# Patient Record
Sex: Male | Born: 1965 | Race: White | Hispanic: No | Marital: Single | Smoking: Current every day smoker
Health system: Southern US, Community
[De-identification: ages and names within clinical notes are randomized; demographics above are authoritative.]

## PROBLEM LIST (undated history)

## (undated) DIAGNOSIS — N200 Calculus of kidney: Secondary | ICD-10-CM

## (undated) HISTORY — PX: APPENDECTOMY: SHX54

## (undated) HISTORY — PX: SHOULDER SURGERY: SHX246

## (undated) HISTORY — PX: KNEE ARTHROSCOPY: SUR90

---

## 1998-08-21 ENCOUNTER — Ambulatory Visit (HOSPITAL_BASED_OUTPATIENT_CLINIC_OR_DEPARTMENT_OTHER): Admission: RE | Admit: 1998-08-21 | Discharge: 1998-08-21 | Payer: Self-pay | Admitting: Orthopedic Surgery

## 1999-06-26 ENCOUNTER — Ambulatory Visit (HOSPITAL_BASED_OUTPATIENT_CLINIC_OR_DEPARTMENT_OTHER): Admission: RE | Admit: 1999-06-26 | Discharge: 1999-06-26 | Payer: Self-pay | Admitting: Orthopedic Surgery

## 2005-07-02 ENCOUNTER — Encounter: Admission: RE | Admit: 2005-07-02 | Discharge: 2005-07-02 | Payer: Self-pay | Admitting: Family Medicine

## 2012-03-08 ENCOUNTER — Other Ambulatory Visit: Payer: Self-pay

## 2012-03-08 ENCOUNTER — Emergency Department (HOSPITAL_COMMUNITY)
Admission: EM | Admit: 2012-03-08 | Discharge: 2012-03-09 | Disposition: A | Discharge status: Home / Self Care | Payer: Self-pay | Attending: Emergency Medicine | Admitting: Emergency Medicine

## 2012-03-08 DIAGNOSIS — F17200 Nicotine dependence, unspecified, uncomplicated: Secondary | ICD-10-CM | POA: Insufficient documentation

## 2012-03-08 DIAGNOSIS — R079 Chest pain, unspecified: Secondary | ICD-10-CM | POA: Insufficient documentation

## 2012-03-08 DIAGNOSIS — R51 Headache: Secondary | ICD-10-CM | POA: Insufficient documentation

## 2012-03-09 ENCOUNTER — Other Ambulatory Visit (HOSPITAL_COMMUNITY): Payer: Self-pay | Admitting: Emergency Medicine

## 2012-03-09 ENCOUNTER — Ambulatory Visit (HOSPITAL_COMMUNITY)
Admission: RE | Admit: 2012-03-09 | Discharge: 2012-03-09 | Disposition: A | Discharge status: Home / Self Care | Payer: Self-pay | Source: Ambulatory Visit | Attending: Emergency Medicine | Admitting: Emergency Medicine

## 2012-03-09 DIAGNOSIS — R079 Chest pain, unspecified: Secondary | ICD-10-CM | POA: Insufficient documentation

## 2012-03-09 DIAGNOSIS — M79609 Pain in unspecified limb: Secondary | ICD-10-CM | POA: Insufficient documentation

## 2012-03-09 LAB — BASIC METABOLIC PANEL
BUN: 13 mg/dL (ref 6–23)
CO2: 25 mEq/L (ref 19–32)
Chloride: 108 mEq/L (ref 96–112)
Glucose, Bld: 91 mg/dL (ref 70–99)
Potassium: 3.9 mEq/L (ref 3.5–5.1)

## 2012-03-09 LAB — POCT I-STAT, CHEM 8
Calcium, Ion: 1.22 mmol/L (ref 1.12–1.23)
Creatinine, Ser: 0.9 mg/dL (ref 0.50–1.35)
Glucose, Bld: 102 mg/dL — ABNORMAL HIGH (ref 70–99)
Hemoglobin: 15.6 g/dL (ref 13.0–17.0)
Potassium: 3.9 mEq/L (ref 3.5–5.1)
TCO2: 24 mmol/L (ref 0–100)

## 2012-03-09 LAB — CBC
Hemoglobin: 15.9 g/dL (ref 13.0–17.0)
MCH: 31.9 pg (ref 26.0–34.0)
RBC: 4.99 MIL/uL (ref 4.22–5.81)

## 2012-03-09 LAB — DIFFERENTIAL
Lymphocytes Relative: 31 % (ref 12–46)
Lymphs Abs: 2.7 10*3/uL (ref 0.7–4.0)
Monocytes Relative: 7 % (ref 3–12)
Neutro Abs: 5.3 10*3/uL (ref 1.7–7.7)
Neutrophils Relative %: 61 % (ref 43–77)

## 2012-03-09 NOTE — ED Notes (Signed)
See downtime charting. 

## 2013-01-16 ENCOUNTER — Encounter (HOSPITAL_COMMUNITY): Payer: Self-pay | Admitting: Emergency Medicine

## 2013-01-16 ENCOUNTER — Emergency Department (HOSPITAL_COMMUNITY)
Admission: EM | Admit: 2013-01-16 | Discharge: 2013-01-16 | Disposition: A | Discharge status: Home / Self Care | Payer: BC Managed Care – PPO | Attending: Emergency Medicine | Admitting: Emergency Medicine

## 2013-01-16 ENCOUNTER — Emergency Department (HOSPITAL_COMMUNITY): Payer: BC Managed Care – PPO

## 2013-01-16 DIAGNOSIS — N201 Calculus of ureter: Secondary | ICD-10-CM | POA: Insufficient documentation

## 2013-01-16 DIAGNOSIS — N209 Urinary calculus, unspecified: Secondary | ICD-10-CM

## 2013-01-16 DIAGNOSIS — F17200 Nicotine dependence, unspecified, uncomplicated: Secondary | ICD-10-CM | POA: Insufficient documentation

## 2013-01-16 DIAGNOSIS — Z885 Allergy status to narcotic agent status: Secondary | ICD-10-CM | POA: Insufficient documentation

## 2013-01-16 DIAGNOSIS — Z87442 Personal history of urinary calculi: Secondary | ICD-10-CM | POA: Insufficient documentation

## 2013-01-16 DIAGNOSIS — R109 Unspecified abdominal pain: Secondary | ICD-10-CM

## 2013-01-16 HISTORY — DX: Calculus of kidney: N20.0

## 2013-01-16 LAB — URINALYSIS, ROUTINE W REFLEX MICROSCOPIC
Glucose, UA: NEGATIVE mg/dL
Ketones, ur: 15 mg/dL — AB
Leukocytes, UA: NEGATIVE
Nitrite: NEGATIVE
Specific Gravity, Urine: 1.039 — ABNORMAL HIGH (ref 1.005–1.030)
Urobilinogen, UA: 1 mg/dL (ref 0.0–1.0)
pH: 5 (ref 5.0–8.0)

## 2013-01-16 LAB — BASIC METABOLIC PANEL
BUN: 19 mg/dL (ref 6–23)
CO2: 22 mEq/L (ref 19–32)
Calcium: 9.9 mg/dL (ref 8.4–10.5)
Chloride: 103 mEq/L (ref 96–112)
Creatinine, Ser: 1.16 mg/dL (ref 0.50–1.35)
GFR calc Af Amer: 86 mL/min — ABNORMAL LOW (ref >90)
GFR calc non Af Amer: 74 mL/min — ABNORMAL LOW (ref >90)
Glucose, Bld: 99 mg/dL (ref 70–99)
Potassium: 4 mEq/L (ref 3.5–5.1)

## 2013-01-16 LAB — CBC WITH DIFFERENTIAL/PLATELET
Basophils Absolute: 0.1 10*3/uL (ref 0.0–0.1)
Basophils Relative: 0 % (ref 0–1)
Eosinophils Absolute: 0 10*3/uL (ref 0.0–0.7)
Eosinophils Relative: 0 % (ref 0–5)
HCT: 46.9 % (ref 39.0–52.0)
Hemoglobin: 16.9 g/dL (ref 13.0–17.0)
Lymphs Abs: 2.5 10*3/uL (ref 0.7–4.0)
MCH: 32.1 pg (ref 26.0–34.0)
MCHC: 36 g/dL (ref 30.0–36.0)
MCV: 89.2 fL (ref 78.0–100.0)
Monocytes Absolute: 0.7 10*3/uL (ref 0.1–1.0)
Monocytes Relative: 5 % (ref 3–12)
Neutrophils Relative %: 75 % (ref 43–77)
Platelets: 272 10*3/uL (ref 150–400)
RDW: 13.4 % (ref 11.5–15.5)

## 2013-01-16 LAB — URINE MICROSCOPIC-ADD ON

## 2013-01-16 LAB — GRAM STAIN

## 2013-01-16 MED ORDER — DOCUSATE SODIUM 100 MG PO CAPS: 100.0000 mg | ORAL_CAPSULE | Freq: Two times a day (BID) | ORAL | Status: DC

## 2013-01-16 MED ORDER — FENTANYL CITRATE 0.05 MG/ML IJ SOLN
50.0000 ug | Freq: Once | INTRAMUSCULAR | Status: AC
  Administered 2013-01-16: 50 ug via INTRAVENOUS

## 2013-01-16 MED ORDER — OXYCODONE-ACETAMINOPHEN 5-325 MG PO TABS
2.0000 | ORAL_TABLET | Freq: Once | ORAL | Status: AC
  Administered 2013-01-16: 2 via ORAL
  Filled 2013-01-16: qty 2

## 2013-01-16 MED ORDER — HYDROMORPHONE HCL PF 1 MG/ML IJ SOLN
0.5000 mg | Freq: Once | INTRAMUSCULAR | Status: AC
  Administered 2013-01-16: 0.5 mg via INTRAVENOUS
  Filled 2013-01-16: qty 1

## 2013-01-16 MED ORDER — SODIUM CHLORIDE 0.9 % IV BOLUS (SEPSIS)
1000.0000 mL | Freq: Once | INTRAVENOUS | Status: AC
  Administered 2013-01-16: 1000 mL via INTRAVENOUS

## 2013-01-16 MED ORDER — KETOROLAC TROMETHAMINE 15 MG/ML IJ SOLN
15.0000 mg | Freq: Once | INTRAMUSCULAR | Status: AC
  Administered 2013-01-16: 15 mg via INTRAVENOUS
  Filled 2013-01-16: qty 1

## 2013-01-16 MED ORDER — ONDANSETRON HCL 4 MG/2ML IJ SOLN
4.0000 mg | Freq: Once | INTRAMUSCULAR | Status: AC
  Administered 2013-01-16: 4 mg via INTRAVENOUS
  Filled 2013-01-16: qty 2

## 2013-01-16 MED ORDER — OXYCODONE-ACETAMINOPHEN 5-325 MG PO TABS: 1.0000 | ORAL_TABLET | ORAL | Status: DC | PRN

## 2013-01-16 MED ORDER — FENTANYL CITRATE 0.05 MG/ML IJ SOLN
INTRAMUSCULAR | Status: AC
  Filled 2013-01-16: qty 2

## 2013-01-16 MED ORDER — TAMSULOSIN HCL 0.4 MG PO CAPS: 0.4000 mg | ORAL_CAPSULE | Freq: Every day | ORAL | Status: DC

## 2013-01-16 NOTE — ED Notes (Signed)
Pt made aware that urine sample is needed. Unable to void at this time.

## 2013-01-16 NOTE — ED Provider Notes (Signed)
History     CSN: [id number]  Arrival date & time 01/16/13  1436   First MD Initiated Contact with Patient 01/16/13 1522      Chief Complaint  Patient presents with  . Flank Pain    (Consider location/radiation/quality/duration/timing/severity/associated sxs/prior treatment) HPI Comments: 47 y.o. male who has pmh of kidney stone, stating that he had a "large right sided kidney stone" many years ago, and now he feels like he has a stone on the left side. He has sharp flank pain, started suddenly today. Pain comes and goes. He states he can't get comfortable. No fevers or chills with this. States he has not been drinking as much water as he usually does over the past week, and noticed blood in his urine today.   Patient is a 47 y.o. male presenting with general illness. The history is provided by the patient.  Illness Location:  Left flank Severity:  Mild Onset quality:  Sudden Timing:  Intermittent Progression:  Waxing and waning Chronicity:  Recurrent Associated symptoms: no abdominal pain, no chest pain, no congestion, no diarrhea, no headaches, no nausea, no rash, no shortness of breath and no wheezing     Past Medical History  Diagnosis Date  . Kidney stone     History reviewed. No pertinent past surgical history.  History reviewed. No pertinent family history.  History  Substance Use Topics  . Smoking status: Current Every Day Smoker  . Smokeless tobacco: Not on file  . Alcohol Use: Yes     Comment: occ      Review of Systems  Constitutional: Negative for chills and activity change.  HENT: Negative for congestion, drooling, mouth sores and neck pain.   Eyes: Negative for pain and discharge.  Respiratory: Negative for chest tightness, shortness of breath and wheezing.   Cardiovascular: Negative for chest pain.  Gastrointestinal: Negative for nausea, abdominal pain, diarrhea and constipation.  Genitourinary: Negative for dysuria, flank pain and difficulty  urinating.       Blood in urine.   Musculoskeletal: Negative for back pain and arthralgias.  Skin: Negative for color change, pallor and rash.  Neurological: Negative for dizziness, weakness, light-headedness and headaches.  Psychiatric/Behavioral: Negative for behavioral problems, confusion and agitation.    Allergies  Codeine  Home Medications  No current outpatient prescriptions on file.  BP 128/86  Pulse 89  Temp(Src) 97.8 F (36.6 C) (Oral)  Resp 18  SpO2 96%  Physical Exam  Constitutional: He is oriented to person, place, and time. He appears well-developed. No distress.  HENT:  Head: Normocephalic.  Eyes: Pupils are equal, round, and reactive to light. Right eye exhibits no discharge. Left eye exhibits no discharge.  Neck: Neck supple. No tracheal deviation present.  Cardiovascular: Regular rhythm and intact distal pulses.   Pulmonary/Chest: Effort normal. No stridor. No respiratory distress. He has no wheezes.  Abdominal: Soft. He exhibits no distension. There is no tenderness. There is no rebound.  Musculoskeletal: Normal range of motion. He exhibits no tenderness.  Neurological: He is alert and oriented to person, place, and time. No cranial nerve deficit.  Skin: Skin is warm. No rash noted. He is not diaphoretic. No erythema.    ED Course  Procedures (including critical care time)  Labs Reviewed  CBC WITH DIFFERENTIAL - Abnormal; Notable for the following:    WBC 12.6 (*)    Neutro Abs 9.4 (*)    All other components within normal limits  BASIC METABOLIC PANEL - Abnormal; Notable for  the following:    GFR calc non Af Amer 74 (*)    GFR calc Af Amer 86 (*)    All other components within normal limits  URINALYSIS, ROUTINE W REFLEX MICROSCOPIC - Abnormal; Notable for the following:    Color, Urine AMBER (*)    APPearance CLOUDY (*)    Specific Gravity, Urine 1.039 (*)    Hgb urine dipstick LARGE (*)    Bilirubin Urine SMALL (*)    Ketones, ur 15 (*)     Protein, ur 30 (*)    All other components within normal limits  URINE MICROSCOPIC-ADD ON - Abnormal; Notable for the following:    Squamous Epithelial / LPF FEW (*)    All other components within normal limits  GRAM STAIN   Ct Abdomen Pelvis Wo Contrast  01/16/2013   *RADIOLOGY REPORT*  Clinical Data: Left flank pain.  History of kidney stones.  CT ABDOMEN AND PELVIS WITHOUT CONTRAST  Technique:  Multidetector CT imaging of the abdomen and pelvis was performed following the standard protocol without intravenous contrast.  Comparison: 07/02/2005.  Findings: There is mild dependent atelectasis at both lung bases. There is an air cyst in the right lower lobe on image #1.  No significant pleural or pericardial effusion is present.  No renal calculi are demonstrated.  There is mild left-sided hydronephrosis and hydroureter with associated perinephric soft tissue stranding.  The left ureter is dilated to an obstructing 2 mm calculus (image 46 at the L3-L4 disc space level).  This is not clearly seen on the scout image.  The distal ureter is decompressed.  There is no evidence of bladder calculus, although the bladder is nearly empty.  No other renal abnormalities are identified.  There is hepatic low density consistent with steatosis.  The spleen, gallbladder, pancreas and adrenal glands appear normal.  No inflammatory changes are identified.  There is metallic foreign body within the lumen of the transverse colon, likely ingested bird shot.  The prostate gland and seminal vesicles appear normal. There is mildly prominent fat within the left inguinal canal which appears stable.  IMPRESSION:  1.  Obstructing 2 mm calculus in the proximal left ureter. Associated mild hydronephrosis. 2.  No renal calculi demonstrated. 3.  Hepatic steatosis.   Original Report Authenticated By: Carey Bullocks, M.D.     MDM  Suspect kidney stone based on pt's prior hx and hpi. Doubt diverticulitis, doubt pyelo, doubt obstruction.  Pt states he has not had frequent CT scans, stating he can't remember the last time he had one. Will get CT stone study to eval for hydro, and size of pt's stone. Will control pain and nausea in the interim.   Pt has urolithiasis -- he has mild hydro, but stone is only 2mm in size. Urine does not show infection.  Due to small stone size, no signs of infection, not febrile -- does not need immediate surgical intervention. Will attempt medical expulsion therapy first with flomax, pain medication. Pt's pain will be controlled in the Er.   Have told pt to return to the ER if he develops fevers / chills, inability to tolerate PO, or if he feels he needs re-evaluation. He is told to f/u with urology in 4 to 5 days if symptoms do not improve and to see his pcp on Monday for re-evaluation.   1. Flank pain   2. Urolithiasis             Bernadene Person, MD 01/16/13 [phone]

## 2013-01-16 NOTE — ED Notes (Addendum)
Pt c/o left sided lower back and flank pain starting this am; pt sts hx of kidney stone and sts feels same; pt sts possible hematuria

## 2013-01-16 NOTE — ED Provider Notes (Signed)
I have supervised the resident on the management of this patient and agree with the note above. I personally interviewed and examined the patient and my addendum is below.   Richard Munoz is a 47 y.o. male hx of R kidney stone s/p lithotripsy here with L flank pain. Sharp, l flank pain. + L CVAT. CT showed 2 mm stone with mild hydro. UA showed no UTI. His pain was controlled in the ED and d/c home on pain meds and flomax and urology f/u.    Richardean Canal, MD 01/16/13 [phone]

## 2013-01-21 ENCOUNTER — Encounter (HOSPITAL_COMMUNITY): Payer: Self-pay | Admitting: Emergency Medicine

## 2013-01-21 ENCOUNTER — Emergency Department (HOSPITAL_COMMUNITY)
Admission: EM | Admit: 2013-01-21 | Discharge: 2013-01-21 | Disposition: A | Discharge status: Home / Self Care | Payer: BC Managed Care – PPO | Attending: Emergency Medicine | Admitting: Emergency Medicine

## 2013-01-21 DIAGNOSIS — N23 Unspecified renal colic: Secondary | ICD-10-CM | POA: Insufficient documentation

## 2013-01-21 DIAGNOSIS — R112 Nausea with vomiting, unspecified: Secondary | ICD-10-CM | POA: Insufficient documentation

## 2013-01-21 DIAGNOSIS — M545 Low back pain, unspecified: Secondary | ICD-10-CM | POA: Insufficient documentation

## 2013-01-21 DIAGNOSIS — R3915 Urgency of urination: Secondary | ICD-10-CM | POA: Insufficient documentation

## 2013-01-21 DIAGNOSIS — F17200 Nicotine dependence, unspecified, uncomplicated: Secondary | ICD-10-CM | POA: Insufficient documentation

## 2013-01-21 DIAGNOSIS — Z79899 Other long term (current) drug therapy: Secondary | ICD-10-CM | POA: Insufficient documentation

## 2013-01-21 DIAGNOSIS — Z87442 Personal history of urinary calculi: Secondary | ICD-10-CM | POA: Insufficient documentation

## 2013-01-21 LAB — POCT I-STAT, CHEM 8
Chloride: 107 mEq/L (ref 96–112)
Creatinine, Ser: 1.1 mg/dL (ref 0.50–1.35)
Glucose, Bld: 95 mg/dL (ref 70–99)
HCT: 49 % (ref 39.0–52.0)
Hemoglobin: 16.7 g/dL (ref 13.0–17.0)
Potassium: 3.9 mEq/L (ref 3.5–5.1)
Sodium: 142 mEq/L (ref 135–145)

## 2013-01-21 LAB — URINALYSIS, ROUTINE W REFLEX MICROSCOPIC
Bilirubin Urine: NEGATIVE
Glucose, UA: NEGATIVE mg/dL
Ketones, ur: NEGATIVE mg/dL
Leukocytes, UA: NEGATIVE
Nitrite: NEGATIVE
Protein, ur: NEGATIVE mg/dL
Specific Gravity, Urine: 1.031 — ABNORMAL HIGH (ref 1.005–1.030)
Urobilinogen, UA: 1 mg/dL (ref 0.0–1.0)
pH: 6 (ref 5.0–8.0)

## 2013-01-21 LAB — URINE MICROSCOPIC-ADD ON

## 2013-01-21 MED ORDER — HYDROMORPHONE HCL PF 1 MG/ML IJ SOLN
1.0000 mg | Freq: Once | INTRAMUSCULAR | Status: AC
  Administered 2013-01-21: 1 mg via INTRAVENOUS
  Filled 2013-01-21: qty 1

## 2013-01-21 MED ORDER — FENTANYL CITRATE 0.05 MG/ML IJ SOLN: 50.0000 ug | Freq: Once | INTRAMUSCULAR | Status: DC

## 2013-01-21 MED ORDER — SODIUM CHLORIDE 0.9 % IV BOLUS (SEPSIS)
1000.0000 mL | Freq: Once | INTRAVENOUS | Status: AC
  Administered 2013-01-21: 1000 mL via INTRAVENOUS

## 2013-01-21 MED ORDER — ONDANSETRON HCL 4 MG/2ML IJ SOLN
4.0000 mg | Freq: Once | INTRAMUSCULAR | Status: AC
  Administered 2013-01-21: 4 mg via INTRAVENOUS
  Filled 2013-01-21: qty 2

## 2013-01-21 MED ORDER — IBUPROFEN 600 MG PO TABS: 600.0000 mg | ORAL_TABLET | Freq: Four times a day (QID) | ORAL | Status: AC | PRN

## 2013-01-21 MED ORDER — HYDROCODONE-ACETAMINOPHEN 5-325 MG PO TABS: ORAL_TABLET | ORAL | Status: DC

## 2013-01-21 MED ORDER — KETOROLAC TROMETHAMINE 30 MG/ML IJ SOLN
30.0000 mg | Freq: Once | INTRAMUSCULAR | Status: AC
  Administered 2013-01-21: 30 mg via INTRAVENOUS
  Filled 2013-01-21: qty 1

## 2013-01-21 NOTE — ED Notes (Signed)
The pt was seen for a kidney stone  Saturday no pain for 2 days until 1600 today.  He has been drinking water but he has not been able to void except in trickles.  His urine has been concentrated

## 2013-01-21 NOTE — ED Notes (Signed)
Iv meds given pt up to the br

## 2013-01-21 NOTE — ED Provider Notes (Signed)
History    This chart was scribed for Rhea Bleacher, PA working with Shelda Jakes, MD by ED Scribe, Burman Nieves. This patient was seen in room TR09C/TR09C and the patient's care was started at 6:10 PM.   CSN: [id number]  Arrival date & time 01/21/13  1745   None     Chief Complaint  Patient presents with  . Flank Pain    (Consider location/radiation/quality/duration/timing/severity/associated sxs/prior treatment) The history is provided by the patient. No language interpreter was used.   HPI Comments: Richard Munoz is a 47 y.o. male who presents to the Emergency Department complaining of moderate lower back pain with associated nausea and vomiting that started around 4 PM today (6/05). Pt states that when he got off work today he felt an increase in urine urgency but reports he could not go. Pt stated that he had a CT scan Saturday (05/31) and was diagnosed with kidney stones and reports the pain is similar to that.  Pt denies fever, chills, cough, diarrhea, SOB, weakness, and any other associated symptoms. Pt had lithotripsy in the past due to a kidney stone the size of a walnut back in 2004.   Past Medical History  Diagnosis Date  . Kidney stone     History reviewed. No pertinent past surgical history.  History reviewed. No pertinent family history.  History  Substance Use Topics  . Smoking status: Current Every Day Smoker  . Smokeless tobacco: Not on file  . Alcohol Use: Yes     Comment: occ      Review of Systems  Constitutional: Negative for fever and chills.  HENT: Negative for sore throat, rhinorrhea and neck pain.   Eyes: Negative for redness.  Respiratory: Negative for cough and shortness of breath.   Cardiovascular: Negative for chest pain.  Gastrointestinal: Positive for nausea and vomiting. Negative for abdominal pain and diarrhea.  Genitourinary: Positive for urgency. Negative for dysuria and hematuria.  Musculoskeletal: Positive for back pain. Negative  for myalgias.  Skin: Negative for rash.  Neurological: Negative for headaches.    Allergies  Codeine  Home Medications   Current Outpatient Rx  Name  Route  Sig  Dispense  Refill  . docusate sodium (COLACE) 100 MG capsule   Oral   Take 1 capsule (100 mg total) by mouth every 12 (twelve) hours.   60 capsule   0   . oxyCODONE-acetaminophen (PERCOCET/ROXICET) 5-325 MG per tablet   Oral   Take 1 tablet by mouth every 4 (four) hours as needed for pain.   30 tablet   0   . ranitidine (ZANTAC) 150 MG capsule   Oral   Take 150 mg by mouth daily.         . tamsulosin (FLOMAX) 0.4 MG CAPS   Oral   Take 1 capsule (0.4 mg total) by mouth daily.   5 capsule   0     BP 144/88  Pulse 86  Temp(Src) 98.7 F (37.1 C) (Oral)  Resp 16  SpO2 97%  Physical Exam  Nursing note and vitals reviewed. Constitutional: He is oriented to person, place, and time. He appears well-developed and well-nourished. No distress.  Pt appears to be in a substantial amount of pain.  HENT:  Head: Normocephalic and atraumatic.  Eyes: Conjunctivae and EOM are normal. Right eye exhibits no discharge. Left eye exhibits no discharge.  Neck: Normal range of motion. Neck supple. No tracheal deviation present.  Cardiovascular: Normal rate, regular rhythm and normal  heart sounds.   Pulmonary/Chest: Effort normal and breath sounds normal. No respiratory distress.  Abdominal: Soft. There is no tenderness.  Musculoskeletal: Normal range of motion. He exhibits tenderness.  Left CVA tenderness to palpation.  Neurological: He is alert and oriented to person, place, and time.  Skin: Skin is warm and dry.  Psychiatric: He has a normal mood and affect. His behavior is normal.    ED Course  Procedures (including critical care time) DIAGNOSTIC STUDIES: Oxygen Saturation is 97% on room air, adequate by my interpretation.    COORDINATION OF CARE: 6:17 PM Discussed ED treatment with pt and pt agrees.     Labs  Reviewed  URINALYSIS, ROUTINE W REFLEX MICROSCOPIC - Abnormal; Notable for the following:    Specific Gravity, Urine 1.031 (*)    Hgb urine dipstick LARGE (*)    All other components within normal limits  URINE MICROSCOPIC-ADD ON  POCT I-STAT, CHEM 8   No results found.   1. Ureteral colic    Patient seen and examined. Work-up initiated. Medications ordered.   Vital signs reviewed and are as follows: Filed Vitals:   01/21/13 2107  BP: 134/90  Pulse: 65  Temp:   Resp: 18   Pain to 2/10 after dilaudid and 0/10 after toradol.   Pt feels comfortable with d/c home. States he will call urology for f/u tomorrow.   Patient counseled on kidney stone treatment. Urged patient to strain urine and save any stones. Urged urology follow-up and return to [facility] with any complications. Counseled patient to maintain good fluid intake.   Patient counseled on use of narcotic pain medications. Counseled not to combine these medications with others containing tylenol. Urged not to drink alcohol, drive, or perform any other activities that requires focus while taking these medications. The patient verbalizes understanding and agrees with the plan.     MDM  Kidney stone, stable renal function, no UTI.   Concern that stone is not progressing as pt has pain with similar character and location. Urged urology f/u.   Pt improved in ED. No vomiting. Stable for outpt treatment. Do not suspect other etiology ex AAA.       I personally performed the services described in this documentation, which was scribed in my presence. The recorded information has been reviewed and is accurate.    Renne Crigler, PA-C 01/23/13 2025

## 2013-01-21 NOTE — ED Notes (Signed)
Pt discharge.Vital signs stable and GCS 15. 

## 2013-01-21 NOTE — ED Notes (Signed)
Pt c/o left back pain; pt recently diagnosed with kidney stone and having same pain

## 2013-01-28 NOTE — ED Provider Notes (Signed)
Medical screening examination/treatment/procedure(s) were performed by non-physician practitioner and as supervising physician I was immediately available for consultation/collaboration.   Donetta Isaza W. Trevor Duty, MD 01/28/13 1214 

## 2014-07-16 ENCOUNTER — Encounter (HOSPITAL_COMMUNITY): Payer: Self-pay | Admitting: *Deleted

## 2014-07-16 ENCOUNTER — Emergency Department (HOSPITAL_COMMUNITY)
Admission: EM | Admit: 2014-07-16 | Discharge: 2014-07-16 | Disposition: A | Discharge status: Home / Self Care | Payer: BC Managed Care – PPO | Attending: Emergency Medicine | Admitting: Emergency Medicine

## 2014-07-16 DIAGNOSIS — Z720 Tobacco use: Secondary | ICD-10-CM | POA: Insufficient documentation

## 2014-07-16 DIAGNOSIS — Z79899 Other long term (current) drug therapy: Secondary | ICD-10-CM | POA: Insufficient documentation

## 2014-07-16 DIAGNOSIS — G44209 Tension-type headache, unspecified, not intractable: Secondary | ICD-10-CM | POA: Insufficient documentation

## 2014-07-16 DIAGNOSIS — Z87442 Personal history of urinary calculi: Secondary | ICD-10-CM | POA: Insufficient documentation

## 2014-07-16 MED ORDER — ACETAMINOPHEN 500 MG PO TABS
1000.0000 mg | ORAL_TABLET | Freq: Once | ORAL | Status: AC
  Administered 2014-07-16: 1000 mg via ORAL
  Filled 2014-07-16: qty 2

## 2014-07-16 NOTE — ED Provider Notes (Signed)
CSN: [id number]     Arrival date & time 07/16/14  2137 History   First MD Initiated Contact with Patient 07/16/14 2212     Chief Complaint  Patient presents with  . Hypertension     (Consider location/radiation/quality/duration/timing/severity/associated sxs/prior Treatment) HPI Comments: Patient is a 48 year old male who presents to the emergency department with complaint of left-sided headache and elevated blood pressure. The patient states that [place] in the jail he developed a left-sided headache, and a throbbing sensation in his neck. His blood pressure was checked there at the jail and he was told that his blood pressures were in the 200s over 170s. He was brought to the emergency department for evaluation. He states he does not have a history of hypertension. He does admit that he is under a great deal of stress because of the incarceration. He denies any vision changes, no vomiting reported, no weakness of any upper or lower extremity, no problems with speech, no problems with walking. He has not taken any medication for this problem.  Patient is a 48 y.o. male presenting with hypertension. The history is provided by the patient.  Hypertension Associated symptoms include headaches. Pertinent negatives include no abdominal pain, arthralgias, chest pain, coughing, neck pain or weakness.    Past Medical History  Diagnosis Date  . Kidney stone    Past Surgical History  Procedure Laterality Date  . Shoulder surgery Bilateral   . Knee arthroscopy Right    History reviewed. No pertinent family history. History  Substance Use Topics  . Smoking status: Current Every Day Smoker  . Smokeless tobacco: Not on file  . Alcohol Use: Yes     Comment: occ    Review of Systems  Constitutional: Negative for activity change.       All ROS Neg except as noted in HPI  HENT: Negative for nosebleeds.   Eyes: Negative for photophobia and discharge.  Respiratory: Negative for cough, shortness of  breath and wheezing.   Cardiovascular: Negative for chest pain and palpitations.  Gastrointestinal: Negative for abdominal pain and blood in stool.  Genitourinary: Negative for dysuria, frequency and hematuria.  Musculoskeletal: Negative for back pain, arthralgias and neck pain.  Skin: Negative.   Neurological: Positive for headaches. Negative for dizziness, seizures, syncope, speech difficulty, weakness and light-headedness.  Psychiatric/Behavioral: Negative for hallucinations and confusion.      Allergies  Codeine  Home Medications   Prior to Admission medications   Medication Sig Start Date End Date Taking? Authorizing Provider  cloNIDine (CATAPRES) 0.2 MG tablet Take 0.2 mg by mouth once.   Yes Historical Provider, MD  ranitidine (ZANTAC) 150 MG capsule Take 150 mg by mouth daily.   Yes Historical Provider, MD  ibuprofen (ADVIL,MOTRIN) 600 MG tablet Take 1 tablet (600 mg total) by mouth every 6 (six) hours as needed for pain. Patient not taking: Reported on 07/16/2014 01/21/13   Renne CriglerJoshua Geiple, PA-C  oxyCODONE-acetaminophen (PERCOCET/ROXICET) 5-325 MG per tablet Take 1 tablet by mouth every 4 (four) hours as needed for pain. Patient not taking: Reported on 07/16/2014 01/16/13   Iltifat Husain, MD  tamsulosin (FLOMAX) 0.4 MG CAPS Take 1 capsule (0.4 mg total) by mouth daily. Patient not taking: Reported on 07/16/2014 01/16/13   Iltifat Husain, MD   BP 148/93 mmHg  Pulse 85  Temp(Src) 98.3 F (36.8 C) (Oral)  Resp 18  Ht 6' (1.829 m)  Wt 251 lb (113.853 kg)  BMI 34.03 kg/m2  SpO2 97% Physical Exam  Constitutional: He  is oriented to person, place, and time. He appears well-developed and well-nourished.  Non-toxic appearance.  HENT:  Head: Normocephalic.  Right Ear: Tympanic membrane and external ear normal.  Left Ear: Tympanic membrane and external ear normal.  Eyes: EOM and lids are normal. Pupils are equal, round, and reactive to light.  Fundoscopic exam:      The right eye  shows no AV nicking, no exudate, no hemorrhage and no papilledema.       The left eye shows no AV nicking, no exudate, no hemorrhage and no papilledema.  Slit lamp exam:      The right eye shows no hyphema.       The left eye shows no hyphema.  Neck: Normal range of motion. Neck supple. Carotid bruit is not present.  No carotid bruit appreciated.  Cardiovascular: Normal rate, regular rhythm, normal heart sounds, intact distal pulses and normal pulses.   Pulmonary/Chest: Breath sounds normal. No respiratory distress.  Abdominal: Soft. Bowel sounds are normal. There is no tenderness. There is no guarding.  Musculoskeletal: Normal range of motion. He exhibits no edema or tenderness.  Lymphadenopathy:       Head (right side): No submandibular adenopathy present.       Head (left side): No submandibular adenopathy present.    He has no cervical adenopathy.  Neurological: He is alert and oriented to person, place, and time. He has normal strength. No cranial nerve deficit or sensory deficit.  Speech is clear and understandable. Gait is intact. No gross neurologic deficit appreciated.  Skin: Skin is warm and dry.  Psychiatric: He has a normal mood and affect. His speech is normal.  Nursing note and vitals reviewed.   ED Course  Procedures (including critical care time) Labs Review Labs Reviewed - No data to display  Imaging Review No results found.   EKG Interpretation None      MDM  Patient reports being under a very stressful situation and having a left-sided headache in the temporal area extending down into the neck and shoulder. Upon his arrival to the emergency department the blood pressure had improved to 148/93. After my examination the blood pressure was 120 systolic. There no gross neurologic deficits appreciated. The speech is clear and understandable there no gait related issues.  The patient is treated with Tylenol for the headache. Feel that it is safe for the patient to  be discharged back to the custody of the sheriff. They will return if any changes, problems, or concerns.    Final diagnoses:  Tension-type headache, not intractable, unspecified chronicity pattern    *I have reviewed nursing notes, vital signs, and all appropriate lab and imaging results for this patient.**    Kathie DikeHobson M Jaques Mineer, PA-C 07/16/14 2327  Flint MelterElliott L Wentz, MD 07/16/14 [phone]

## 2014-07-16 NOTE — ED Notes (Signed)
Per Secretary, Sgt. Kennon signed for pt to be released. Pt maid bail and was to go home with his family that was in the waiting room.

## 2014-07-16 NOTE — Discharge Instructions (Signed)
Your blood pressure has improved significantly. There were no gross neurologic or vascular problems noted on your examination at this time. Please use Tylenol every 4 hours, or ibuprofen every 6 hours if needed for headache. Tension Headache A tension headache is pain, pressure, or aching felt over the front and sides of the head. Tension headaches often come after stress, feeling worried (anxiety), or feeling sad or down for a while (depressed). HOME CARE  Only take medicine as told by your doctor.  Lie down in a dark, quiet room when you have a headache.  Keep a journal to find out if certain things bring on headaches. For example, write down:  What you eat and drink.  How much sleep you get.  Any change to your diet or medicines.  Relax by getting a massage or doing other relaxing activities.  Put ice or heat packs on the head and neck area as told by your doctor.  Lessen stress.  Sit up straight. Do not tighten (tense) your muscles.  Quit smoking if you smoke.  Lessen how much alcohol you drink.  Lessen how much caffeine you drink, or stop drinking caffeine.  Eat and exercise regularly.  Get enough sleep.  Avoid using too much pain medicine. GET HELP RIGHT AWAY IF:   Your headache becomes really bad.  You have a fever.  You have a stiff neck.  You have trouble seeing.  Your muscles are weak, or you lose muscle control.  You lose your balance or have trouble walking.  You feel like you will pass out (faint), or you pass out.  You have really bad symptoms that are different than your first symptoms.  You have problems with the medicines given to you by your doctor.  Your medicines do not work.  Your headache feels different than the other headaches.  You feel sick to your stomach (nauseous) or throw up (vomit). MAKE SURE YOU:   Understand these instructions.  Will watch your condition.  Will get help right away if you are not doing well or get  worse. Document Released: 10/30/2009 Document Revised: 10/28/2011 Document Reviewed: 07/26/2011 [facility] Patient Information 2015 [employer], [place]. This information is not intended to replace advice given to you by your health care provider. Make sure you discuss any questions you have with your health care provider.

## 2014-07-16 NOTE — ED Notes (Signed)
Pt c/o headache and when his BP was taken it was 200's/170's

## 2015-01-26 IMAGING — CT CT ABD-PELV W/O CM
2 of 4 series · 14 of 32 positions shown, 19 images · non-contrast
Comparison: 07/02/2005.

CLINICAL DATA: Left flank pain.  History of kidney stones.

CT ABDOMEN AND PELVIS WITHOUT CONTRAST
TECHNIQUE: Multidetector CT imaging of the abdomen and pelvis was
performed following the standard protocol without intravenous
contrast.

[Series 2: renal stone · axial · 0.84mm/px · z∈[-481,-111]mm · 7 of 100 slices shown, 12 images]
[im 13/100  soft-tissue]
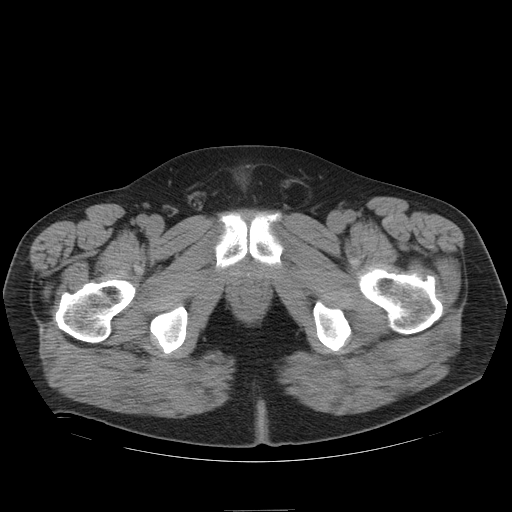
[im 13/100  bone]
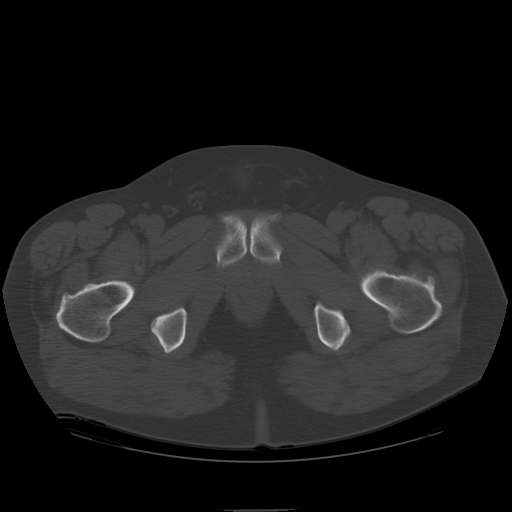
[im 25/100  soft-tissue]
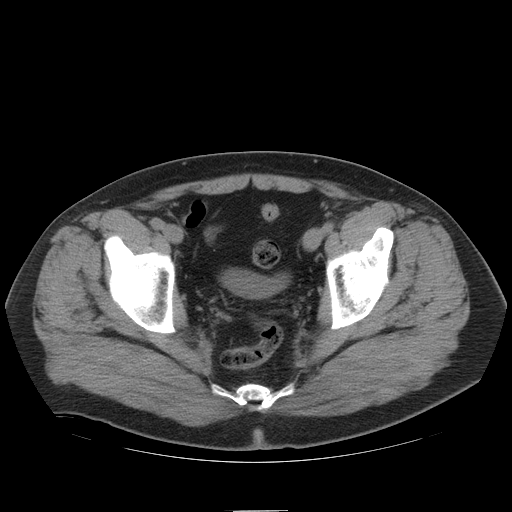
[im 38/100  soft-tissue]
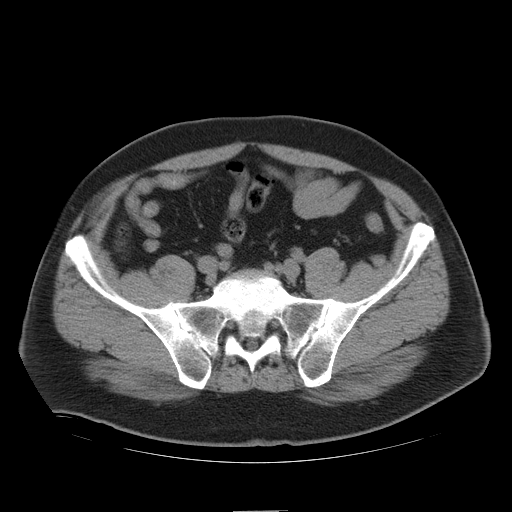
[im 50/100  soft-tissue]
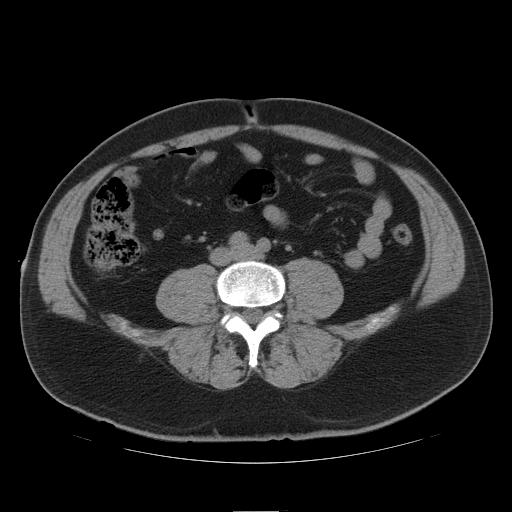
[im 50/100  lung]
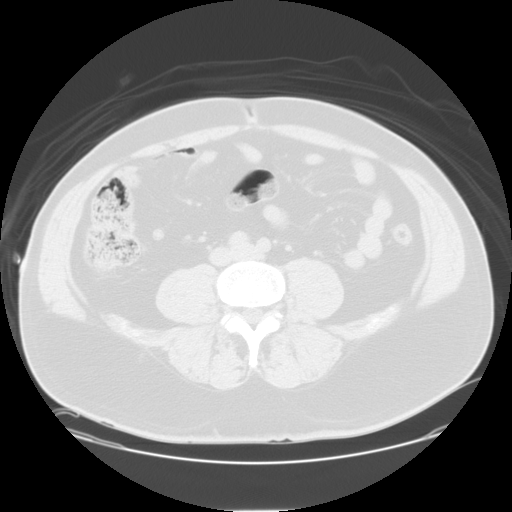
[im 62/100  soft-tissue]
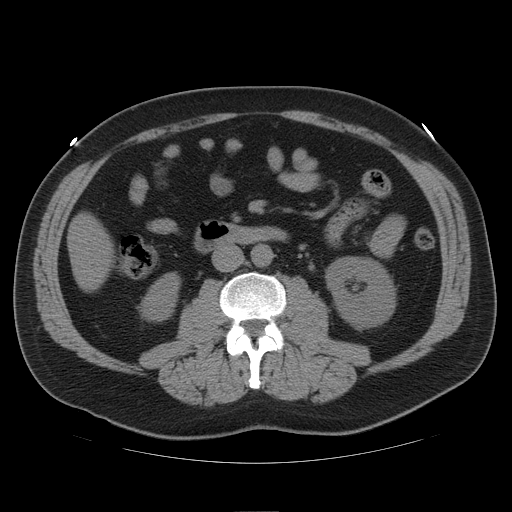
[im 62/100  lung]
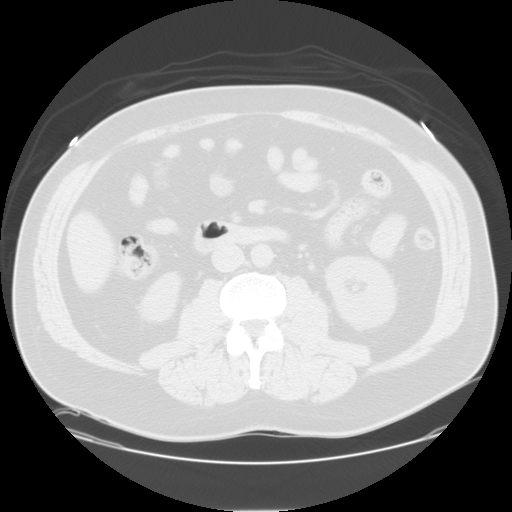
[im 75/100  soft-tissue]
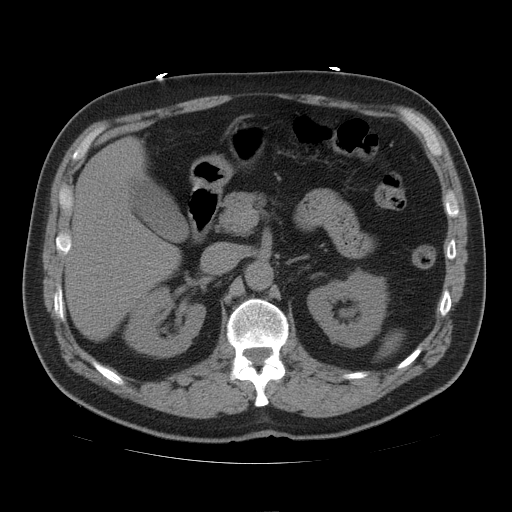
[im 75/100  lung]
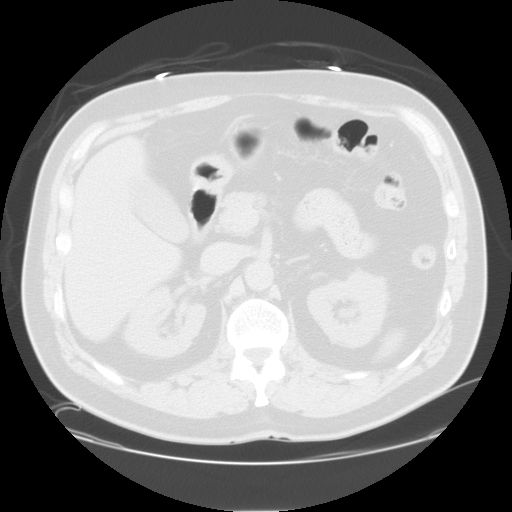
[im 87/100  soft-tissue]
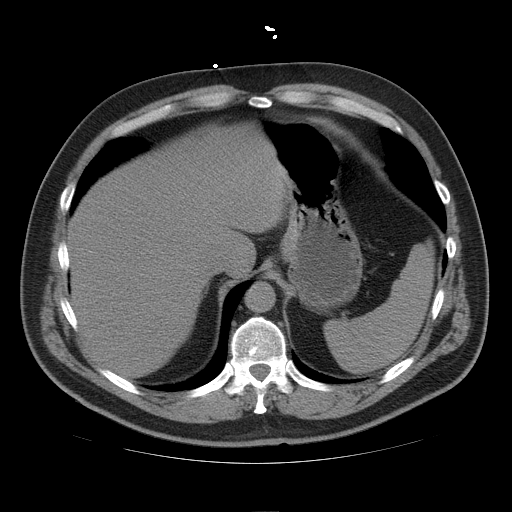
[im 87/100  lung]
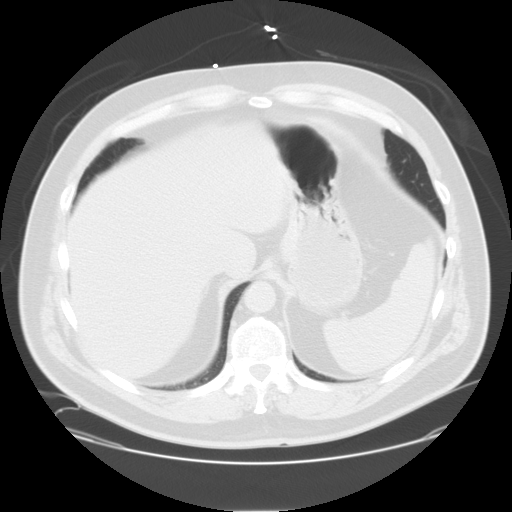

[Series 400: sag · sagittal · 1.00mm/px · 7 of 131 slices shown]
[im 14/131  soft-tissue]
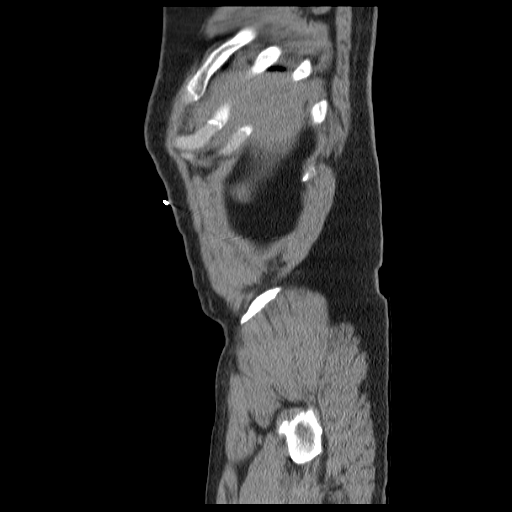
[im 27/131  soft-tissue]
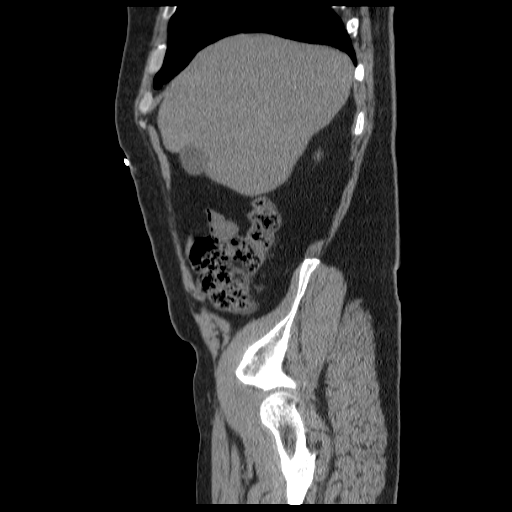
[im 40/131  soft-tissue]
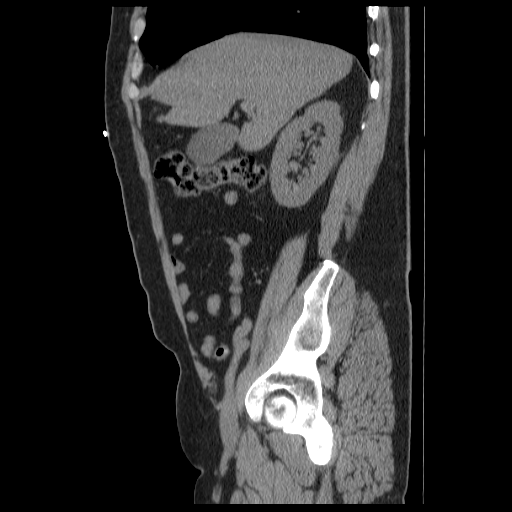
[im 53/131  soft-tissue]
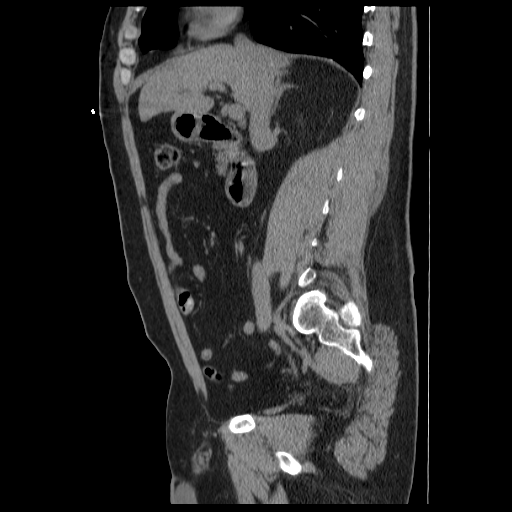
[im 79/131  soft-tissue]
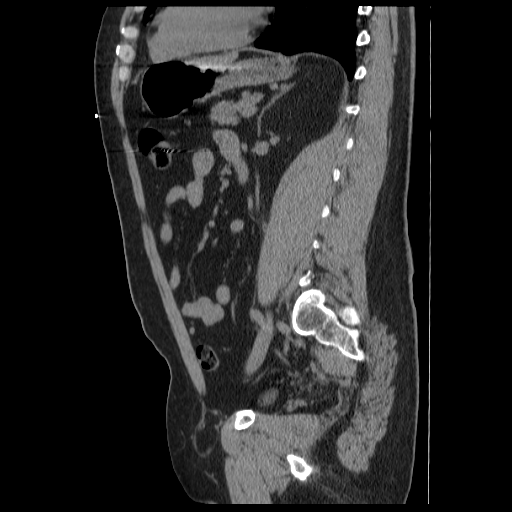
[im 92/131  soft-tissue]
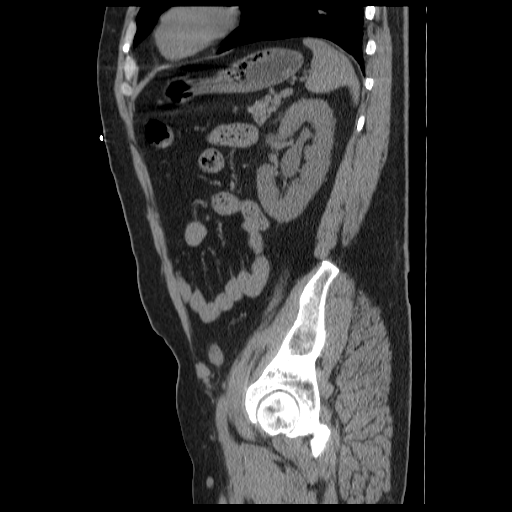
[im 105/131  soft-tissue]
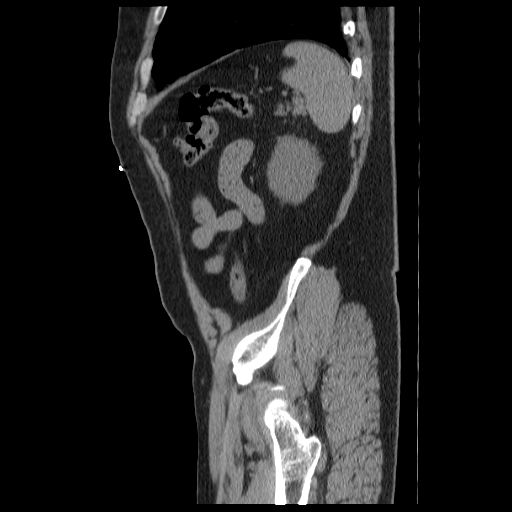

[14 of 32 positions shown; findings below may reference images not displayed]

FINDINGS: There is mild dependent atelectasis at both lung bases.
There is an air cyst in the right lower lobe on image #1.  No
significant pleural or pericardial effusion is present.

No renal calculi are demonstrated.  There is mild left-sided
hydronephrosis and hydroureter with associated perinephric soft
tissue stranding.  The left ureter is dilated to an obstructing 2
mm calculus (image 46 at the L3-L4 disc space level).  This is not
clearly seen on the scout image.  The distal ureter is
decompressed.  There is no evidence of bladder calculus, although
the bladder is nearly empty.  No other renal abnormalities are
identified.

There is hepatic low density consistent with steatosis.  The
spleen, gallbladder, pancreas and adrenal glands appear normal.

No inflammatory changes are identified.  There is metallic foreign
body within the lumen of the transverse colon, likely ingested bird
shot.  The prostate gland and seminal vesicles appear normal.
There is mildly prominent fat within the left inguinal canal which
appears stable.
IMPRESSION: 1.  Obstructing 2 mm calculus in the proximal left ureter.
Associated mild hydronephrosis.
2.  No renal calculi demonstrated.
3.  Hepatic steatosis.

## 2016-02-29 ENCOUNTER — Encounter (HOSPITAL_COMMUNITY): Payer: Self-pay | Admitting: *Deleted

## 2016-02-29 ENCOUNTER — Emergency Department (HOSPITAL_COMMUNITY)
Admission: EM | Admit: 2016-02-29 | Discharge: 2016-02-29 | Disposition: A | Discharge status: Home / Self Care | Payer: PRIVATE HEALTH INSURANCE | Attending: Emergency Medicine | Admitting: Emergency Medicine

## 2016-02-29 DIAGNOSIS — Y999 Unspecified external cause status: Secondary | ICD-10-CM | POA: Insufficient documentation

## 2016-02-29 DIAGNOSIS — Y929 Unspecified place or not applicable: Secondary | ICD-10-CM | POA: Insufficient documentation

## 2016-02-29 DIAGNOSIS — S30861A Insect bite (nonvenomous) of abdominal wall, initial encounter: Secondary | ICD-10-CM | POA: Insufficient documentation

## 2016-02-29 DIAGNOSIS — Y939 Activity, unspecified: Secondary | ICD-10-CM | POA: Insufficient documentation

## 2016-02-29 DIAGNOSIS — W57XXXA Bitten or stung by nonvenomous insect and other nonvenomous arthropods, initial encounter: Secondary | ICD-10-CM | POA: Insufficient documentation

## 2016-02-29 DIAGNOSIS — Z87891 Personal history of nicotine dependence: Secondary | ICD-10-CM | POA: Insufficient documentation

## 2016-02-29 MED ORDER — DOXYCYCLINE HYCLATE 100 MG PO CAPS: 100.0000 mg | ORAL_CAPSULE | Freq: Two times a day (BID) | ORAL | Status: AC

## 2016-02-29 MED ORDER — DOXYCYCLINE HYCLATE 100 MG PO TABS
100.0000 mg | ORAL_TABLET | Freq: Once | ORAL | Status: AC
  Administered 2016-02-29: 100 mg via ORAL
  Filled 2016-02-29: qty 1

## 2016-02-29 NOTE — ED Notes (Signed)
Pt c/o "lump" area with redness noted to right abd area that started today, denies any drainage, pt states that the area started itching today while at work,

## 2016-02-29 NOTE — Discharge Instructions (Signed)
Insect Bite °Mosquitoes, flies, fleas, bedbugs, and other insects can bite. Insect bites are different from insect stings. The bite may be red, puffy (swollen), and itchy for 2 to 4 days. Most bites get better on their own. °HOME CARE  °· Do not scratch the bite. °· Keep the bite clean and dry. Wash the bite with soap and water every day, as told by your doctor. °· If directed, apply ice to the bite area. °¨ Put ice in a plastic bag. °¨ Place a towel between your skin and the bag. °¨ Leave the ice on for 20 minutes, 2-3 times per day. °· Follow instructions from your doctor about using medicated lotions or creams. These can help with itching. °· Apply or take over-the-counter and prescription medicines only as told by your doctor. °· If you were given an antibiotic medicine, use it as told by your doctor. Do not stop using the medicine even if your condition improves. °· Keep all follow-up visits as told by your doctor. This is important. °GET HELP IF: °· You have redness, swelling (inflammation), or pain near your bite that is getting worse. °· You have a fever. °GET HELP RIGHT AWAY IF:  °· You have joint pain.   °· You have fluid, blood, or pus coming from the bite area.   °· You have a headache. °· You have neck pain. °· You feel weaker than you normally do.   °· You have a rash.   °· You have chest pain. °· You have shortness of breath. °· You have stomach pain, feel sick to your stomach (nauseous), or throw up (vomit). °· You feel more tired or sleepy than you normally do. °  °This information is not intended to replace advice given to you by your health care provider. Make sure you discuss any questions you have with your health care provider. °  °Document Released: 08/02/2000 Document Revised: 04/26/2015 Document Reviewed: 12/21/2014 °Elsevier Interactive Patient Education ©2016 [employer] Inc[employer] ° °

## 2016-02-29 NOTE — ED Provider Notes (Signed)
CSN: [id number]     Arrival date & time 02/29/16  2210 History   First MD Initiated Contact with Patient 02/29/16 2218     Chief Complaint  Patient presents with  . Abscess     (Consider location/radiation/quality/duration/timing/severity/associated sxs/prior Treatment) HPI   Richard Munoz is a 50 y.o. male who presents to the Emergency Department complaining of "soreness" and itching to right lower abdomen.  He states that he noticed itching to the area earlier today while at work and noticed increasing redness of the area this evening.  Unsure if he may have been bitten by something.  He states that his girlfriend marked the redness earlier today, but redness has increased since then. He is not taking any medications prior to arrival. He denies fever, chills, nausea or vomiting, rash and joint pains.   Past Medical History  Diagnosis Date  . Kidney stone    Past Surgical History  Procedure Laterality Date  . Shoulder surgery Bilateral   . Knee arthroscopy Right   . Appendectomy     No family history on file. Social History  Substance Use Topics  . Smoking status: Former Games developer  . Smokeless tobacco: None  . Alcohol Use: Yes     Comment: occ    Review of Systems  Constitutional: Negative for fever and chills.  Gastrointestinal: Negative for nausea and vomiting.  Musculoskeletal: Negative for joint swelling and arthralgias.  Skin: Positive for color change.  Hematological: Negative for adenopathy.  All other systems reviewed and are negative.     Allergies  Codeine  Home Medications   Prior to Admission medications   Medication Sig Start Date End Date Taking? Authorizing Provider  cloNIDine (CATAPRES) 0.2 MG tablet Take 0.2 mg by mouth once.    Historical Provider, MD  doxycycline (VIBRAMYCIN) 100 MG capsule Take 1 capsule (100 mg total) by mouth 2 (two) times daily. For 10 days 02/29/16   Merced Brougham, PA-C  ibuprofen (ADVIL,MOTRIN) 600 MG tablet Take 1  tablet (600 mg total) by mouth every 6 (six) hours as needed for pain. Patient not taking: Reported on 07/16/2014 01/21/13   Renne Crigler, PA-C  oxyCODONE-acetaminophen (PERCOCET/ROXICET) 5-325 MG per tablet Take 1 tablet by mouth every 4 (four) hours as needed for pain. Patient not taking: Reported on 07/16/2014 01/16/13   Iltifat Husain, MD  ranitidine (ZANTAC) 150 MG capsule Take 150 mg by mouth daily.    Historical Provider, MD  tamsulosin (FLOMAX) 0.4 MG CAPS Take 1 capsule (0.4 mg total) by mouth daily. Patient not taking: Reported on 07/16/2014 01/16/13   Iltifat Husain, MD   BP 154/103 mmHg  Pulse 82  Temp(Src) 98.5 F (36.9 C) (Oral)  Resp 16  Ht 6' (1.829 m)  Wt 106.595 kg  BMI 31.86 kg/m2  SpO2 97% Physical Exam  Constitutional: He is oriented to person, place, and time. He appears well-developed and well-nourished. No distress.  HENT:  Head: Normocephalic and atraumatic.  Mouth/Throat: Oropharynx is clear and moist.  Neck: Normal range of motion.  Cardiovascular: Normal rate, regular rhythm and normal heart sounds.   No murmur heard. Pulmonary/Chest: Effort normal and breath sounds normal. No respiratory distress.  Abdominal: Soft. He exhibits no distension. There is no tenderness.  Musculoskeletal: Normal range of motion.  Neurological: He is alert and oriented to person, place, and time. He exhibits normal muscle tone. Coordination normal.  Skin: Skin is warm and dry. There is erythema.  Localized erythema of the right lower abdominal wall  with 2 cm central area of induration and pin point scab. No fluctuance.  Nursing note and vitals reviewed.   ED Course  Procedures (including critical care time) Labs Review Labs Reviewed - No data to display  Imaging Review No results found. I have personally reviewed and evaluated these images and lab results as part of my medical decision-making.   EKG Interpretation None      MDM   Final diagnoses:  Insect bite of  abdomen with local reaction, initial encounter   Leading edge of erythema marked be me.  Localized reaction with erythema, small amt of induration and itching.  Likely insect bite, no drainable abscess at this time.  No fever, rash, arthralgias to suggest tick bite.  Pt agrees to OTC hydrocortisone cream and Rx doxy.  Agrees to warm compresses and ER return if worsening     Pauline Ausammy Charlie Char, PA-C 03/01/16 0020  Vanetta MuldersScott Zackowski, MD 03/01/16 [phone]

## 2016-03-01 ENCOUNTER — Encounter (HOSPITAL_COMMUNITY): Payer: Self-pay | Admitting: *Deleted

## 2016-03-01 ENCOUNTER — Emergency Department (HOSPITAL_COMMUNITY)
Admission: EM | Admit: 2016-03-01 | Discharge: 2016-03-01 | Disposition: A | Discharge status: Home / Self Care | Payer: PRIVATE HEALTH INSURANCE | Attending: Emergency Medicine | Admitting: Emergency Medicine

## 2016-03-01 DIAGNOSIS — R110 Nausea: Secondary | ICD-10-CM | POA: Insufficient documentation

## 2016-03-01 DIAGNOSIS — Z87891 Personal history of nicotine dependence: Secondary | ICD-10-CM | POA: Insufficient documentation

## 2016-03-01 DIAGNOSIS — L03311 Cellulitis of abdominal wall: Secondary | ICD-10-CM

## 2016-03-01 MED ORDER — VANCOMYCIN HCL IN DEXTROSE 1-5 GM/200ML-% IV SOLN
1000.0000 mg | Freq: Once | INTRAVENOUS | Status: AC
  Administered 2016-03-01: 1000 mg via INTRAVENOUS
  Filled 2016-03-01: qty 200

## 2016-03-01 MED ORDER — HYDROCODONE-ACETAMINOPHEN 5-325 MG PO TABS: ORAL_TABLET | ORAL | Status: AC

## 2016-03-01 NOTE — ED Notes (Signed)
Bitten by unknown creature/insect and now with a reddened and hot area to his right lower abd region. He reports no pain unless he touches it or has something on it then it is increasingly tender. He also reports using hydrocortisone ointment on it without improvement

## 2016-03-01 NOTE — ED Notes (Signed)
Pt was seen here yesterday and treated for a reddened area on his abdomen. The area on pt's abdomen is getting redder and has spread outside of marked area. Pt also reporting body aches.

## 2016-03-01 NOTE — ED Provider Notes (Signed)
CSN: [id number]     Arrival date & time 03/01/16  [id number] History   First MD Initiated Contact with Patient 03/01/16 1937     Chief Complaint  Patient presents with  . Cellulitis     (Consider location/radiation/quality/duration/timing/severity/associated sxs/prior Treatment) HPI   Richard Munoz is a 50 y.o. male who Returns to the Emergency Department complaining of increased redness and myalgias. Patient was seen here last evening and treated for cellulitis to his right lower abdomen. Patient was given prescription for doxycycline, he has taken 3 doses thus far. He states the redness has increased since last night and is associated with increasing pain to the area with movement. He states today that he noticed pain to his shoulders and knees and elbows. He denies rash, fever, chills, and vomiting.  He states he does work outside and is unsure if he may have been bitten by a tick.   Past Medical History  Diagnosis Date  . Kidney stone    Past Surgical History  Procedure Laterality Date  . Shoulder surgery Bilateral   . Knee arthroscopy Right   . Appendectomy     History reviewed. No pertinent family history. Social History  Substance Use Topics  . Smoking status: Former Games developermoker  . Smokeless tobacco: None  . Alcohol Use: Yes     Comment: occ    Review of Systems  Constitutional: Positive for fatigue. Negative for fever and chills.  Gastrointestinal: Positive for nausea. Negative for vomiting and abdominal distention.  Musculoskeletal: Positive for arthralgias. Negative for joint swelling.  Skin: Positive for color change.       Redness itching and pain right lower abdomen  Hematological: Negative for adenopathy.  All other systems reviewed and are negative.     Allergies  Codeine  Home Medications   Prior to Admission medications   Medication Sig Start Date End Date Taking? Authorizing Provider  cloNIDine (CATAPRES) 0.2 MG tablet Take 0.2 mg by mouth once.     Historical Provider, MD  doxycycline (VIBRAMYCIN) 100 MG capsule Take 1 capsule (100 mg total) by mouth 2 (two) times daily. For 10 days 02/29/16   Annalisa Colonna, PA-C  ibuprofen (ADVIL,MOTRIN) 600 MG tablet Take 1 tablet (600 mg total) by mouth every 6 (six) hours as needed for pain. Patient not taking: Reported on 07/16/2014 01/21/13   Renne CriglerJoshua Geiple, PA-C  oxyCODONE-acetaminophen (PERCOCET/ROXICET) 5-325 MG per tablet Take 1 tablet by mouth every 4 (four) hours as needed for pain. Patient not taking: Reported on 07/16/2014 01/16/13   Iltifat Husain, MD  ranitidine (ZANTAC) 150 MG capsule Take 150 mg by mouth daily.    Historical Provider, MD  tamsulosin (FLOMAX) 0.4 MG CAPS Take 1 capsule (0.4 mg total) by mouth daily. Patient not taking: Reported on 07/16/2014 01/16/13   Iltifat Husain, MD   BP 155/95 mmHg  Pulse 70  Temp(Src) 98.9 F (37.2 C)  Resp 18  Ht 6' (1.829 m)  Wt 106.595 kg  BMI 31.86 kg/m2  SpO2 98% Physical Exam  Constitutional: He is oriented to person, place, and time. He appears well-developed and well-nourished. No distress.  HENT:  Head: Normocephalic and atraumatic.  Mouth/Throat: Oropharynx is clear and moist.  Neck: Normal range of motion.  Cardiovascular: Normal rate, regular rhythm and normal heart sounds.   No murmur heard. Pulmonary/Chest: Effort normal and breath sounds normal. No respiratory distress.  Abdominal: He exhibits no distension. There is no tenderness. There is no guarding.  Neurological: He is alert and  oriented to person, place, and time. He exhibits normal muscle tone. Coordination normal.  Skin: Skin is warm and dry. There is erythema.  Mild to moderately increasing erythema to the right lower abdominal wall. Small bite mark is still present. No obvious abscess.  Nursing note and vitals reviewed.   ED Course  Procedures (including critical care time) Labs Review Labs Reviewed - No data to display  Imaging Review No results found. I  have personally reviewed and evaluated these images and lab results as part of my medical decision-making.   EKG Interpretation None      MDM   Final diagnoses:  Cellulitis of abdominal wall   Patient also seen by me on previous visit. Erythema has slightly extended beyond previously marked margins.  He is otherwise well appearing.  Non-toxic appearing. Will give IV Vancomycin here and have pt continue doxycycline as previously prescribed.    Erythema of the right abdominal wall was visualized by myself using bedside ultrasound.  No abscess seen, no foreign body.  Images not archived.    Pt also seen by Dr. Adriana Simas and care plan discussed.  Stable for d/c     Pauline Aus, PA-C 03/02/16 1713  Donnetta Hutching, MD 03/02/16 [phone]

## 2016-03-01 NOTE — Discharge Instructions (Signed)
Cellulitis °Cellulitis is an infection of the skin and the tissue under the skin. The infected area is usually red and tender. This happens most often in the arms and lower legs. °HOME CARE  °· Take your antibiotic medicine as told. Finish the medicine even if you start to feel better. °· Keep the infected arm or leg raised (elevated). °· Put a warm cloth on the area up to 4 times per day. °· Only take medicines as told by your doctor. °· Keep all doctor visits as told. °GET HELP IF: °· You see red streaks on the skin coming from the infected area. °· Your red area gets bigger or turns a dark color. °· Your bone or joint under the infected area is painful after the skin heals. °· Your infection comes back in the same area or different area. °· You have a puffy (swollen) bump in the infected area. °· You have new symptoms. °· You have a fever. °GET HELP RIGHT AWAY IF:  °· You feel very sleepy. °· You throw up (vomit) or have watery poop (diarrhea). °· You feel sick and have muscle aches and pains. °  °This information is not intended to replace advice given to you by your health care provider. Make sure you discuss any questions you have with your health care provider. °  °Document Released: 01/22/2008 Document Revised: 04/26/2015 Document Reviewed: 10/21/2011 °Elsevier Interactive Patient Education ©2016 Elsevier Inc. ° °

## 2016-03-04 MED FILL — Hydrocodone-Acetaminophen Tab 5-325 MG: ORAL | Qty: 6 | Status: AC

## 2019-09-20 ENCOUNTER — Ambulatory Visit
Admission: EM | Admit: 2019-09-20 | Discharge: 2019-09-20 | Disposition: A | Discharge status: Home / Self Care | Payer: Self-pay | Attending: Family Medicine | Admitting: Family Medicine

## 2019-09-20 ENCOUNTER — Other Ambulatory Visit: Payer: Self-pay

## 2019-09-20 DIAGNOSIS — R21 Rash and other nonspecific skin eruption: Secondary | ICD-10-CM

## 2019-09-20 MED ORDER — PREDNISONE 10 MG (21) PO TBPK: ORAL_TABLET | Freq: Every day | ORAL | 0 refills | Status: AC

## 2019-09-20 NOTE — ED Provider Notes (Signed)
[facility]    CSN: [id number] Arrival date & time: 09/20/19  1837      History   Chief Complaint Chief Complaint  Patient presents with  . Rash    HPI Richard Munoz is a 54 y.o. male.   Reports that he has had a rash under his L arm since 09/18/19. He reports associated itching when the rash first appeared and now is tender. Denies having shingles ever before. Denies fever, cough, n/v/d, fever, new skin exposures. Denies any treatments at home.   The history is provided by the patient.  Rash Associated symptoms: no abdominal pain, no fever, no headaches, no joint pain, no shortness of breath, no sore throat and not vomiting     Past Medical History:  Diagnosis Date  . Kidney stone     There are no problems to display for this patient.   Past Surgical History:  Procedure Laterality Date  . APPENDECTOMY    . KNEE ARTHROSCOPY Right   . SHOULDER SURGERY Bilateral        Home Medications    Prior to Admission medications   Medication Sig Start Date End Date Taking? Authorizing Provider  doxycycline (VIBRAMYCIN) 100 MG capsule Take 1 capsule (100 mg total) by mouth 2 (two) times daily. For 10 days 02/29/16   Pauline Aus, PA-C  HYDROcodone-acetaminophen (NORCO/VICODIN) 5-325 MG tablet Take one-two tabs po q 4-6 hrs prn pain 03/01/16   Triplett, Tammy, PA-C  ibuprofen (ADVIL,MOTRIN) 600 MG tablet Take 1 tablet (600 mg total) by mouth every 6 (six) hours as needed for pain. Patient not taking: Reported on 07/16/2014 01/21/13   Renne Crigler, PA-C  predniSONE (STERAPRED UNI-PAK 21 TAB) 10 MG (21) TBPK tablet Take by mouth daily for 6 days. Take 6 tablets on day 1, 5 tablets on day 2, 4 tablets on day 3, 3 tablets on day 4, 2 tablets on day 5, 1 tablet on day 6 09/20/19 09/26/19  Moshe Cipro, NP  ranitidine (ZANTAC) 150 MG capsule Take 150 mg by mouth daily as needed for heartburn.     [provider]    Family History Family History    Problem Relation Age of Onset  . Healthy Mother   . Healthy Father     Social History Social History   Tobacco Use  . Smoking status: Current Every Day Smoker    Packs/day: 1.00    Types: Cigarettes  . Smokeless tobacco: Never Used  Substance Use Topics  . Alcohol use: Yes    Comment: occ  . Drug use: No     Allergies   Codeine   Review of Systems Review of Systems  Constitutional: Negative for chills and fever.  HENT: Negative for ear pain and sore throat.   Eyes: Negative for pain and visual disturbance.  Respiratory: Negative for cough and shortness of breath.   Cardiovascular: Negative for chest pain and palpitations.  Gastrointestinal: Negative for abdominal pain and vomiting.  Genitourinary: Negative for dysuria and hematuria.  Musculoskeletal: Negative for arthralgias and back pain.  Skin: Positive for rash. Negative for color change.       Under L arm  Allergic/Immunologic: Negative for environmental allergies, food allergies and immunocompromised state.  Neurological: Negative for dizziness, seizures, syncope, weakness, light-headedness and headaches.  All other systems reviewed and are negative.    Physical Exam Triage Vital Signs ED Triage Vitals [09/20/19 1848]  Enc Vitals Group     BP  Pulse      Resp      Temp      Temp src      SpO2      Weight      Height      Head Circumference      Peak Flow      Pain Score 4     Pain Loc      Pain Edu?      Excl. in [place]?    No data found.  Updated Vital Signs There were no vitals taken for this visit.  Visual Acuity Right Eye Distance:   Left Eye Distance:   Bilateral Distance:    Right Eye Near:   Left Eye Near:    Bilateral Near:     Physical Exam Vitals and nursing note reviewed.  Constitutional:      General: He is not in acute distress.    Appearance: Normal appearance. He is well-developed.  HENT:     Head: Normocephalic and atraumatic.     Nose: Nose normal.  Eyes:      Conjunctiva/sclera: Conjunctivae normal.  Cardiovascular:     Rate and Rhythm: Normal rate and regular rhythm.     Heart sounds: Normal heart sounds. No murmur.  Pulmonary:     Effort: Pulmonary effort is normal. No respiratory distress.     Breath sounds: Normal breath sounds. No wheezing or rhonchi.  Abdominal:     General: Abdomen is flat.     Palpations: Abdomen is soft.     Tenderness: There is no abdominal tenderness.  Musculoskeletal:     Cervical back: Neck supple.  Skin:    General: Skin is warm and dry.     Findings: Rash present.     Comments: Under both arms, to R lower back where pants rub  Neurological:     General: No focal deficit present.     Mental Status: He is alert and oriented to person, place, and time.  Psychiatric:        Mood and Affect: Mood normal.        Behavior: Behavior normal.      UC Treatments / Results  Labs (all labs ordered are listed, but only abnormal results are displayed) Labs Reviewed - No data to display  EKG   Radiology No results found.  Procedures Procedures (including critical care time)  Medications Ordered in UC Medications - No data to display  Initial Impression / Assessment and Plan / UC Course  I have reviewed the triage vital signs and the nursing notes.  Pertinent labs & imaging results that were available during my care of the patient were reviewed by me and considered in my medical decision making (see chart for details).     Presents with rash to both underarms, R lower back. Had some itching, now tender. Prescribed steroid burst. If symptoms persist, follow up with dermatology. Final Clinical Impressions(s) / UC Diagnoses   Final diagnoses:  Rash and nonspecific skin eruption     Discharge Instructions     You have a skin rash, but it is not Shingles.   I have sent in a steroid to your pharmacy. Take as directed on the package.  If your symptoms persist after the steroid course, follow up with  dermatology.  Report to the Emergency Department for any shortness of breath, high fever, or other concerning symptoms.      ED Prescriptions    Medication Sig Dispense Auth. Provider  predniSONE (STERAPRED UNI-PAK 21 TAB) 10 MG (21) TBPK tablet Take by mouth daily for 6 days. Take 6 tablets on day 1, 5 tablets on day 2, 4 tablets on day 3, 3 tablets on day 4, 2 tablets on day 5, 1 tablet on day 6 21 tablet Moshe Cipro, NP     PDMP not reviewed this encounter.   Moshe Cipro, NP 09/20/19 1914

## 2019-09-20 NOTE — Discharge Instructions (Signed)
You have a skin rash, but it is not Shingles.   I have sent in a steroid to your pharmacy. Take as directed on the package.  If your symptoms persist after the steroid course, follow up with dermatology.  Report to the Emergency Department for any shortness of breath, high fever, or other concerning symptoms.

## 2019-09-20 NOTE — ED Triage Notes (Signed)
Pt presents to UC w/ c/o rash under left forearm x2 days. Started as itchy rash now is very tender. Pt believes it may be shingles.

## 2021-09-13 ENCOUNTER — Encounter (HOSPITAL_COMMUNITY): Payer: Self-pay

## 2021-09-13 ENCOUNTER — Emergency Department (HOSPITAL_COMMUNITY): Payer: Self-pay

## 2021-09-13 ENCOUNTER — Emergency Department (HOSPITAL_COMMUNITY)
Admission: EM | Admit: 2021-09-13 | Discharge: 2021-09-13 | Disposition: A | Discharge status: Home / Self Care | Payer: Self-pay | Attending: Emergency Medicine | Admitting: Emergency Medicine

## 2021-09-13 ENCOUNTER — Other Ambulatory Visit: Payer: Self-pay

## 2021-09-13 DIAGNOSIS — N201 Calculus of ureter: Secondary | ICD-10-CM | POA: Insufficient documentation

## 2021-09-13 LAB — CBC WITH DIFFERENTIAL/PLATELET
Abs Immature Granulocytes: 0.06 10*3/uL (ref 0.00–0.07)
Basophils Absolute: 0 10*3/uL (ref 0.0–0.1)
Basophils Relative: 0 %
Eosinophils Absolute: 0 10*3/uL (ref 0.0–0.5)
Eosinophils Relative: 0 %
HCT: 48.7 % (ref 39.0–52.0)
Hemoglobin: 16.7 g/dL (ref 13.0–17.0)
Immature Granulocytes: 0 %
Lymphocytes Relative: 7 %
Lymphs Abs: 1 10*3/uL (ref 0.7–4.0)
MCH: 31.7 pg (ref 26.0–34.0)
MCHC: 34.3 g/dL (ref 30.0–36.0)
MCV: 92.4 fL (ref 80.0–100.0)
Monocytes Absolute: 0.6 10*3/uL (ref 0.1–1.0)
Monocytes Relative: 4 %
Neutro Abs: 12.8 10*3/uL — ABNORMAL HIGH (ref 1.7–7.7)
Neutrophils Relative %: 89 %
Platelets: 199 10*3/uL (ref 150–400)
RBC: 5.27 MIL/uL (ref 4.22–5.81)
RDW: 13.5 % (ref 11.5–15.5)
WBC: 14.5 10*3/uL — ABNORMAL HIGH (ref 4.0–10.5)
nRBC: 0 % (ref 0.0–0.2)

## 2021-09-13 LAB — URINALYSIS, ROUTINE W REFLEX MICROSCOPIC
Bacteria, UA: NONE SEEN
Bilirubin Urine: NEGATIVE
Glucose, UA: NEGATIVE mg/dL
Ketones, ur: 5 mg/dL — AB
Leukocytes,Ua: NEGATIVE
Nitrite: NEGATIVE
Protein, ur: NEGATIVE mg/dL
RBC / HPF: >50 RBC/hpf — ABNORMAL HIGH (ref 0–5)
Specific Gravity, Urine: 1.019 (ref 1.005–1.030)
pH: 6 (ref 5.0–8.0)

## 2021-09-13 LAB — BASIC METABOLIC PANEL
Anion gap: 4 — ABNORMAL LOW (ref 5–15)
BUN: 13 mg/dL (ref 6–20)
CO2: 24 mmol/L (ref 22–32)
Calcium: 8.7 mg/dL — ABNORMAL LOW (ref 8.9–10.3)
Chloride: 107 mmol/L (ref 98–111)
Creatinine, Ser: 1.04 mg/dL (ref 0.61–1.24)
GFR, Estimated: >60 mL/min (ref >60)
Glucose, Bld: 131 mg/dL — ABNORMAL HIGH (ref 70–99)
Potassium: 3.7 mmol/L (ref 3.5–5.1)
Sodium: 135 mmol/L (ref 135–145)

## 2021-09-13 MED ORDER — TAMSULOSIN HCL 0.4 MG PO CAPS: 0.4000 mg | ORAL_CAPSULE | Freq: Every day | ORAL | 0 refills | Status: AC

## 2021-09-13 MED ORDER — TAMSULOSIN HCL 0.4 MG PO CAPS
0.4000 mg | ORAL_CAPSULE | Freq: Once | ORAL | Status: AC
  Administered 2021-09-13: 0.4 mg via ORAL
  Filled 2021-09-13: qty 1

## 2021-09-13 MED ORDER — OXYCODONE-ACETAMINOPHEN 5-325 MG PO TABS: 1.0000 | ORAL_TABLET | ORAL | 0 refills | Status: AC | PRN

## 2021-09-13 MED ORDER — FENTANYL CITRATE PF 50 MCG/ML IJ SOSY
100.0000 ug | PREFILLED_SYRINGE | Freq: Once | INTRAMUSCULAR | Status: AC
  Administered 2021-09-13: 100 ug via INTRAVENOUS
  Filled 2021-09-13: qty 2

## 2021-09-13 MED ORDER — KETOROLAC TROMETHAMINE 30 MG/ML IJ SOLN
15.0000 mg | Freq: Once | INTRAMUSCULAR | Status: AC
  Administered 2021-09-13: 15 mg via INTRAVENOUS
  Filled 2021-09-13: qty 1

## 2021-09-13 MED ORDER — ONDANSETRON HCL 4 MG/2ML IJ SOLN
4.0000 mg | Freq: Once | INTRAMUSCULAR | Status: AC
  Administered 2021-09-13: 4 mg via INTRAVENOUS
  Filled 2021-09-13: qty 2

## 2021-09-13 NOTE — Discharge Instructions (Addendum)
Take the pain medication prescribed as needed, use caution as this will make you drowsy, do not drive within 4 hours of taking this medication.  Take your next dose of Flomax tomorrow evening as you have received today's dose here.  Make sure you are straining your urine so you will know when his kidney stones pass.  Call Dr. Alyson Ingles for further management of your kidney stones.  Return here immediately if you develop uncontrolled pain, vomiting or if you develop a fever.

## 2021-09-13 NOTE — ED Provider Notes (Signed)
[facility] EMERGENCY DEPARTMENT Provider Note   CSN: [id number] Arrival date & time: 09/13/21  [id number]     History  Chief Complaint  Patient presents with   Flank Pain    Richard Munoz is a 56 y.o. male with no significant past medical history except for distant history of kidney stones requiring lithotripsy presenting for evaluation of right flank pain.  Over the past week he has noticed slight tinges of pain at his right flank but nothing severe, until this morning he woke with severe pain in the right flank without radiation along with gross hematuria.  He denies fevers or chills.  He does endorse nausea with vomiting.  He denies abdominal pain.  He has had softer than normal stools this morning but no frank diarrhea.  He has had no treatment prior to arrival.  The history is provided by the patient.      Home Medications Prior to Admission medications   Medication Sig Start Date End Date Taking? Authorizing Provider  oxyCODONE-acetaminophen (PERCOCET/ROXICET) 5-325 MG tablet Take 1 tablet by mouth every 4 (four) hours as needed. 09/13/21  Yes Nkechi Linehan, Almyra Free, PA-C  tamsulosin (FLOMAX) 0.4 MG CAPS capsule Take 1 capsule (0.4 mg total) by mouth daily after supper. 09/13/21  Yes Joao Mccurdy, Almyra Free, PA-C  doxycycline (VIBRAMYCIN) 100 MG capsule Take 1 capsule (100 mg total) by mouth 2 (two) times daily. For 10 days Patient not taking: Reported on 09/13/2021 02/29/16   Kem Parkinson, PA-C  HYDROcodone-acetaminophen (NORCO/VICODIN) 5-325 MG tablet Take one-two tabs po q 4-6 hrs prn pain Patient not taking: Reported on 09/13/2021 03/01/16   Triplett, Tammy, PA-C  ibuprofen (ADVIL,MOTRIN) 600 MG tablet Take 1 tablet (600 mg total) by mouth every 6 (six) hours as needed for pain. Patient not taking: Reported on 07/16/2014 01/21/13   Carlisle Cater, PA-C      Allergies    Codeine    Review of Systems   Review of Systems  Constitutional:  Negative for fever.  HENT:  Negative for congestion and sore  throat.   Eyes: Negative.   Respiratory:  Negative for chest tightness and shortness of breath.   Cardiovascular:  Negative for chest pain.  Gastrointestinal:  Positive for nausea and vomiting. Negative for abdominal pain.  Genitourinary:  Positive for flank pain and hematuria. Negative for dysuria, frequency and urgency.  Musculoskeletal:  Negative for arthralgias, joint swelling and neck pain.  Skin: Negative.  Negative for rash and wound.  Neurological:  Negative for dizziness, weakness, light-headedness, numbness and headaches.  Psychiatric/Behavioral: Negative.    All other systems reviewed and are negative.  Physical Exam Updated Vital Signs BP (!) 147/93    Pulse (!) 54    Temp 97.9 F (36.6 C) (Oral)    Resp 18    Ht 6' (1.829 m)    Wt 108.9 kg    SpO2 97%    BMI 32.55 kg/m  Physical Exam Vitals and nursing note reviewed.  Constitutional:      Appearance: He is well-developed.  HENT:     Head: Normocephalic and atraumatic.  Eyes:     Conjunctiva/sclera: Conjunctivae normal.  Cardiovascular:     Rate and Rhythm: Normal rate and regular rhythm.     Heart sounds: Normal heart sounds.  Pulmonary:     Effort: Pulmonary effort is normal.     Breath sounds: Normal breath sounds. No wheezing.  Abdominal:     General: Bowel sounds are normal.     Palpations: Abdomen  is soft.     Tenderness: There is no abdominal tenderness. There is right CVA tenderness.  Musculoskeletal:        General: Normal range of motion.     Cervical back: Normal range of motion.  Skin:    General: Skin is warm and dry.  Neurological:     Mental Status: He is alert.    ED Results / Procedures / Treatments   Labs (all labs ordered are listed, but only abnormal results are displayed) Labs Reviewed  BASIC METABOLIC PANEL - Abnormal; Notable for the following components:      Result Value   Glucose, Bld 131 (*)    Calcium 8.7 (*)    Anion gap 4 (*)    All other components within normal limits   CBC WITH DIFFERENTIAL/PLATELET - Abnormal; Notable for the following components:   WBC 14.5 (*)    Neutro Abs 12.8 (*)    All other components within normal limits  URINALYSIS, ROUTINE W REFLEX MICROSCOPIC - Abnormal; Notable for the following components:   Hgb urine dipstick LARGE (*)    Ketones, ur 5 (*)    RBC / HPF >50 (*)    All other components within normal limits    EKG None  Radiology No results found.  CT Renal Stone Study  Result Date: 09/13/2021 CLINICAL DATA:  Right flank pain and hematuria beginning yesterday. Nephrolithiasis. Prior appendectomy. EXAM: CT ABDOMEN AND PELVIS WITHOUT CONTRAST TECHNIQUE: Multidetector CT imaging of the abdomen and pelvis was performed following the standard protocol without IV contrast. RADIATION DOSE REDUCTION: This exam was performed according to the departmental dose-optimization program which includes automated exposure control, adjustment of the mA and/or kV according to patient size and/or use of iterative reconstruction technique. COMPARISON:  01/16/2013 FINDINGS: Lower chest: No acute findings. Hepatobiliary: Tiny sub-cm low-attenuation lesion in the posterior right hepatic lobe is too small to characterize, most likely represents a tiny cyst. Gallbladder is unremarkable. No evidence of biliary ductal dilatation. Pancreas: No mass or inflammatory process visualized on this unenhanced exam. Spleen:  Within normal limits in size. Adrenals/Urinary tract: 2 mm calculus noted in the lower pole the right kidney. Mild right hydronephrosis and moderate perinephric stranding is seen due to a 3 mm calculus in the proximal right ureter. Unremarkable unopacified urinary bladder. Stomach/Bowel: No evidence of obstruction, inflammatory process, or abnormal fluid collections. Mild sigmoid diverticulosis is noted, however there is no evidence of diverticulitis. Vascular/Lymphatic: No pathologically enlarged lymph nodes identified. No evidence of abdominal  aortic aneurysm. Reproductive:  No mass or other significant abnormality. Other:  None. Musculoskeletal:  No suspicious bone lesions identified. IMPRESSION: Mild right hydronephrosis and moderate perinephric stranding due to 3 mm proximal right ureteral calculus. 2 mm right lower pole renal calculus. Mild sigmoid diverticulosis. No radiographic evidence of diverticulitis. Electronically Signed   By: Marlaine Hind M.D.   On: 09/13/2021 10:10    Procedures Procedures    Medications Ordered in ED Medications  ondansetron (ZOFRAN) injection 4 mg (4 mg Intravenous Given 09/13/21 0945)  fentaNYL (SUBLIMAZE) injection 100 mcg (100 mcg Intravenous Given 09/13/21 0944)  tamsulosin (FLOMAX) capsule 0.4 mg (0.4 mg Oral Given 09/13/21 1338)  ketorolac (TORADOL) 30 MG/ML injection 15 mg (15 mg Intravenous Given 09/13/21 1338)    ED Course/ Medical Decision Making/ A&P                           Medical Decision Making Flank pain  with known h/o kidney stone requiring lithotripsy in distant hx.  No fevers, no dysuria, n/v this am.    Amount and/or Complexity of Data Reviewed Labs: ordered.    Details: labs revealing leukocytosis at 14.5, prob acute phase reaction to pain and vomiting.  no UTI per UA today.  renal function normal. Radiology: ordered and independent interpretation performed.    Details: 3 mm right proximal ureteral stone, 2 mm right renal pelvis stone.  mild hydronephrosis.  Risk Prescription drug management. Decision regarding hospitalization. Risk Details: No indication for admission with no urinary infection. Return precautions outlined.  Prescribed pain medicine, flomax, given urine strainer.  Plan f/u with urology, referral given.initially given fentanyl here with moderate improvement in pain, but started to escalate, added toradol after CT imaging completed - pain resolved.            Final Clinical Impression(s) / ED Diagnoses Final diagnoses:  Ureterolithiasis    Rx /  DC Orders ED Discharge Orders          Ordered    oxyCODONE-acetaminophen (PERCOCET/ROXICET) 5-325 MG tablet  Every 4 hours PRN        09/13/21 1445    tamsulosin (FLOMAX) 0.4 MG CAPS capsule  Daily after supper        09/13/21 1445              Evalee Jefferson, PA-C 09/15/21 1803    Truddie Hidden, MD 09/18/21 [phone]

## 2021-09-13 NOTE — ED Triage Notes (Signed)
Complaints of right flank pain that started yesterday with hematuria. Does have history of previous kidney stones.

## 2023-09-23 IMAGING — CT CT RENAL STONE PROTOCOL
2 of 4 series · 16 of 46 positions shown, 18 images · non-contrast
Comparison: 01/16/2013

CLINICAL DATA: Right flank pain and hematuria beginning yesterday.
Nephrolithiasis. Prior appendectomy.



[Series 2: axial st · axial · 0.92mm/px · z∈[+741,+1181]mm · 13 of 102 slices shown, 15 images]
[im 7/102  soft-tissue]
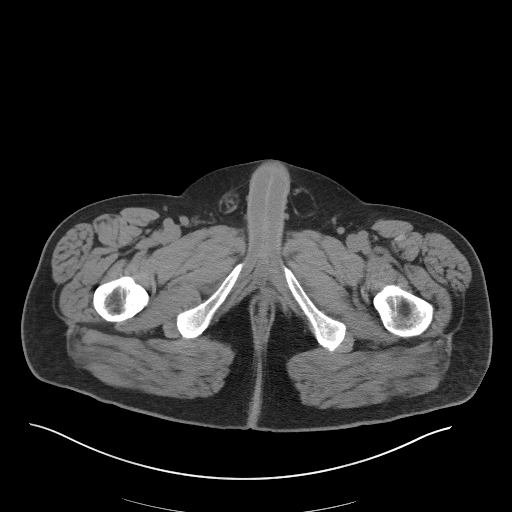
[im 7/102  bone]
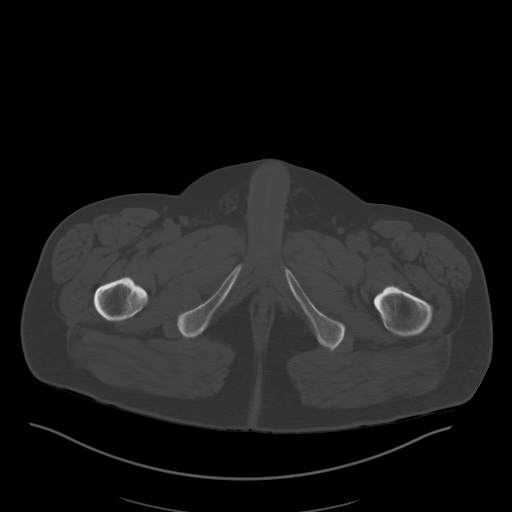
[im 13/102  soft-tissue]
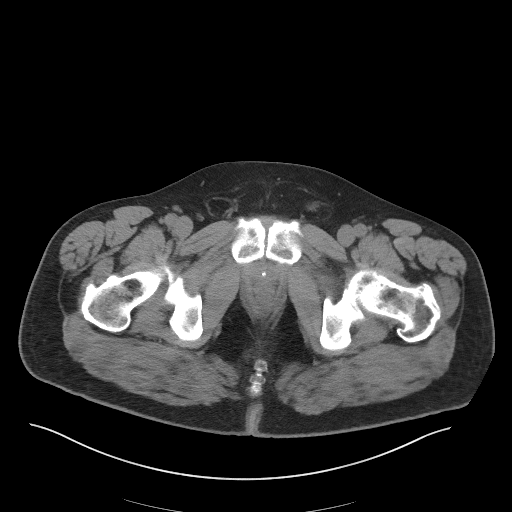
[im 19/102  soft-tissue]
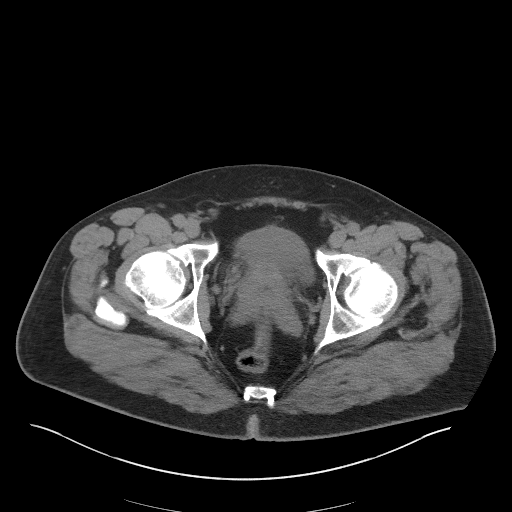
[im 32/102  soft-tissue]
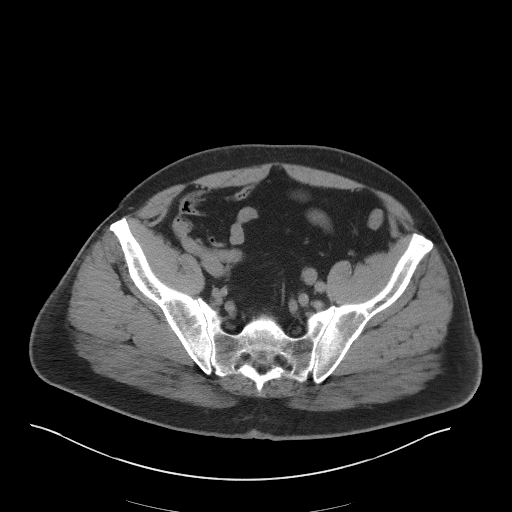
[im 38/102  soft-tissue]
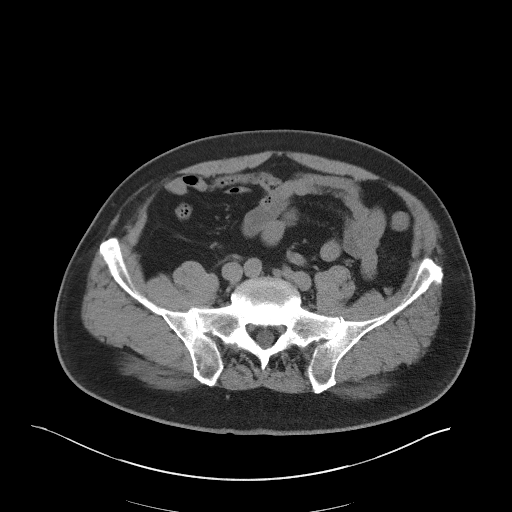
[im 45/102  soft-tissue]
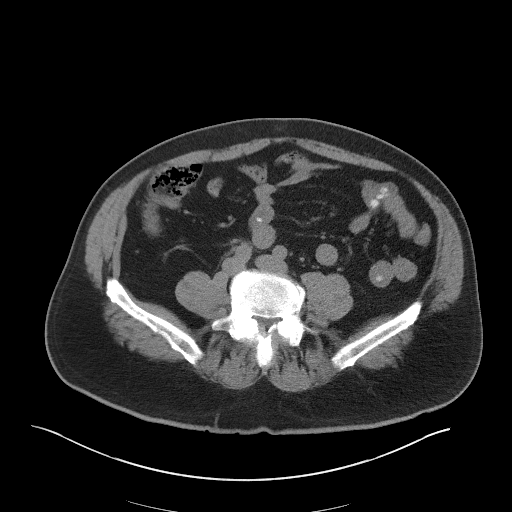
[im 51/102  soft-tissue]
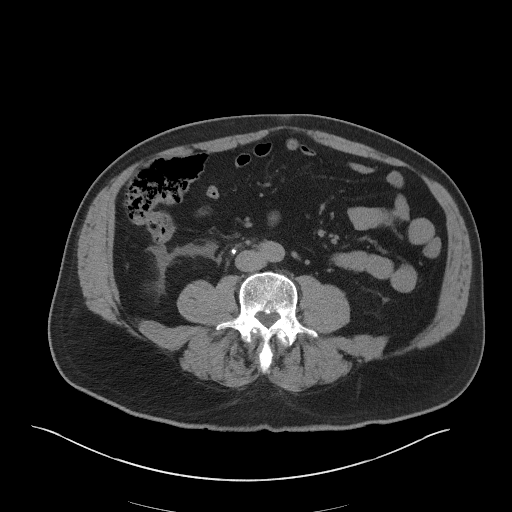
[im 57/102  soft-tissue]
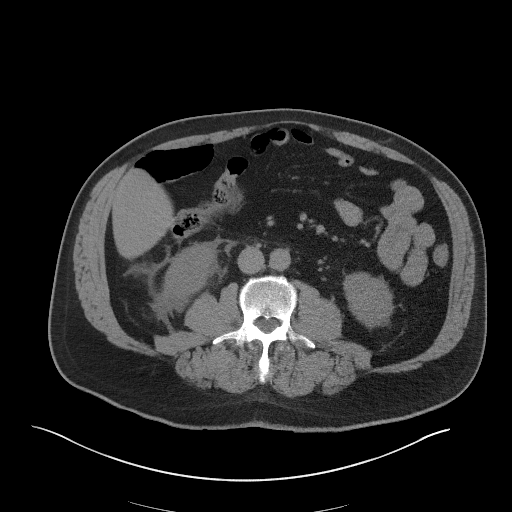
[im 64/102  soft-tissue]
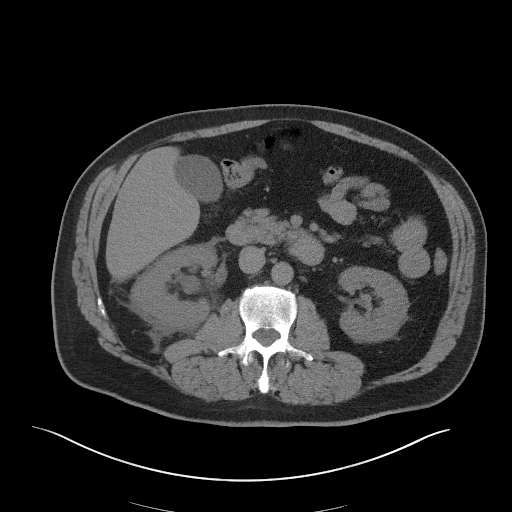
[im 64/102  bone]
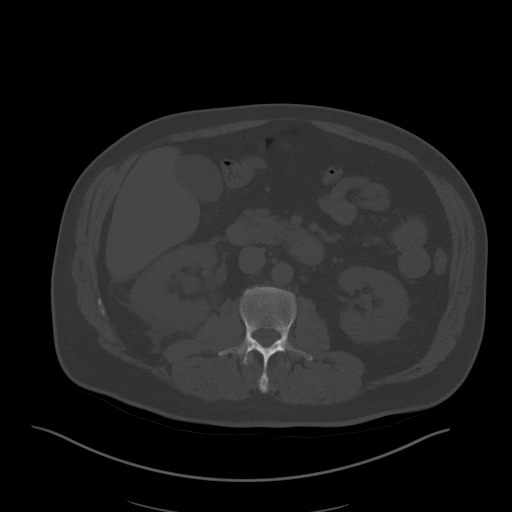
[im 70/102  soft-tissue]
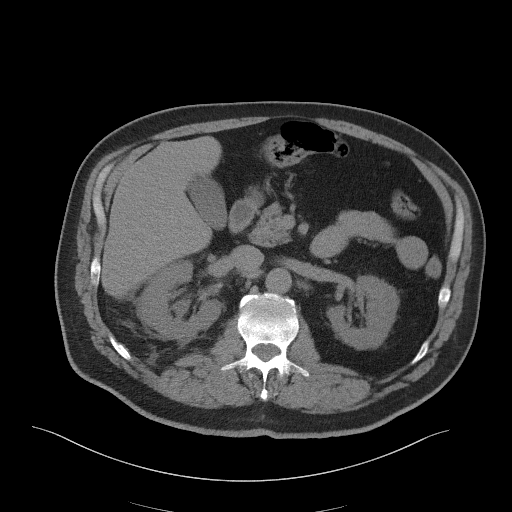
[im 83/102  soft-tissue]
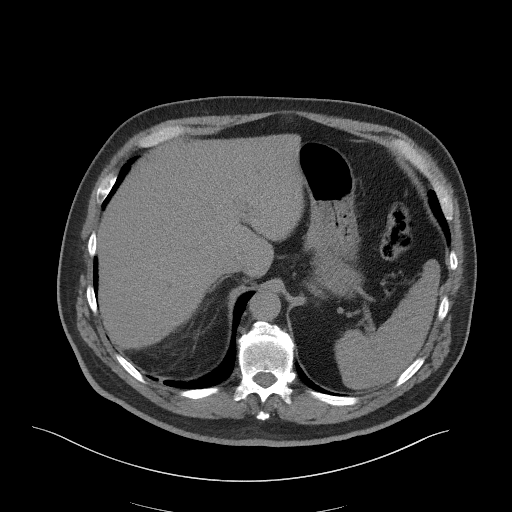
[im 89/102  soft-tissue]
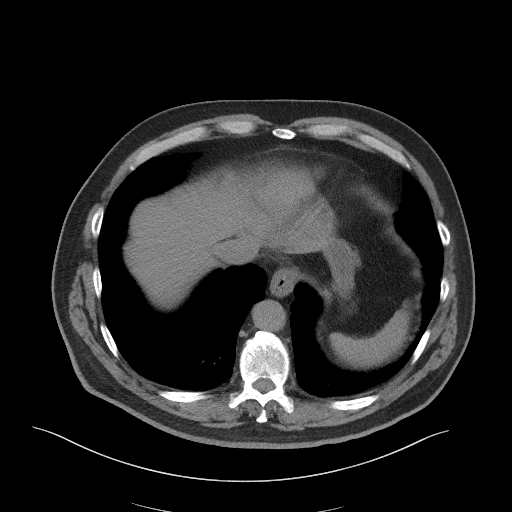
[im 95/102  soft-tissue]
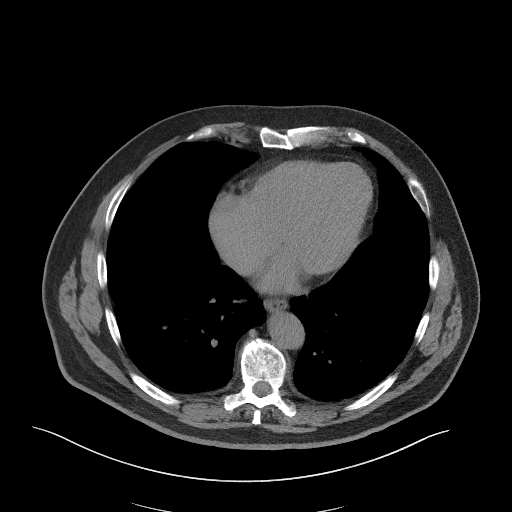

[Series 5: coronal st · coronal · 0.86mm/px · 3 of 101 slices shown]
[im 34/101  soft-tissue]
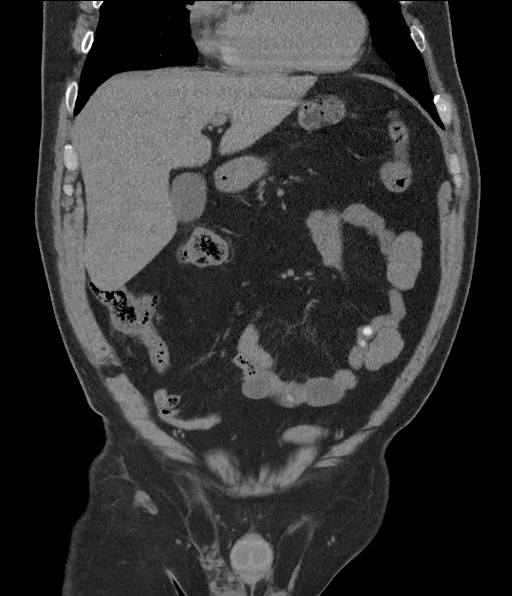
[im 45/101  soft-tissue]
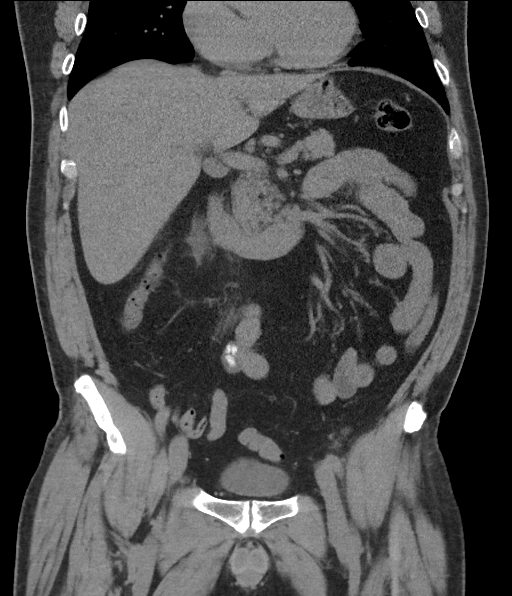
[im 56/101  soft-tissue]
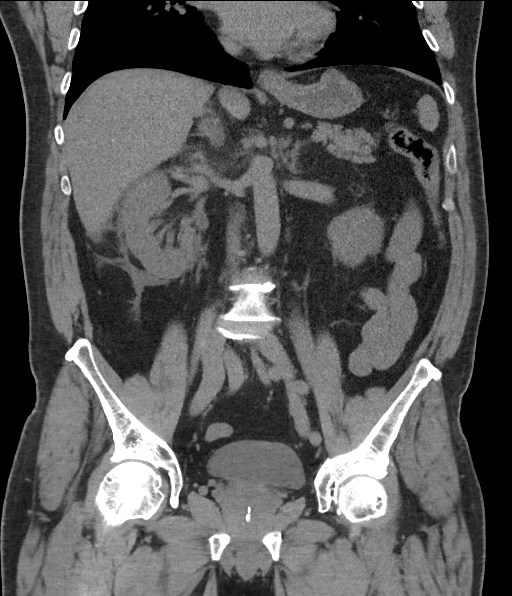

[16 of 46 positions shown; findings below may reference images not displayed]

FINDINGS: Lower chest: No acute findings.

Hepatobiliary: Tiny sub-cm low-attenuation lesion in the posterior
right hepatic lobe is too small to characterize, most likely
represents a tiny cyst. Gallbladder is unremarkable. No evidence of
biliary ductal dilatation.

Pancreas: No mass or inflammatory process visualized on this
unenhanced exam.

Spleen:  Within normal limits in size.

Adrenals/Urinary tract: 2 mm calculus noted in the lower pole the
right kidney. Mild right hydronephrosis and moderate perinephric
stranding is seen due to a 3 mm calculus in the proximal right
ureter. Unremarkable unopacified urinary bladder.

Stomach/Bowel: No evidence of obstruction, inflammatory process, or
abnormal fluid collections. Mild sigmoid diverticulosis is noted,
however there is no evidence of diverticulitis.

Vascular/Lymphatic: No pathologically enlarged lymph nodes
identified. No evidence of abdominal aortic aneurysm.

Reproductive:  No mass or other significant abnormality.

Other:  None.

Musculoskeletal:  No suspicious bone lesions identified.
IMPRESSION: Mild right hydronephrosis and moderate perinephric stranding due to
3 mm proximal right ureteral calculus.

2 mm right lower pole renal calculus.

Mild sigmoid diverticulosis. No radiographic evidence of
diverticulitis.

## 2024-02-03 ENCOUNTER — Other Ambulatory Visit: Payer: Self-pay | Admitting: Family Medicine

## 2024-02-03 DIAGNOSIS — Z87891 Personal history of nicotine dependence: Secondary | ICD-10-CM
